# Patient Record
Sex: Female | Born: 1945
Health system: Southern US, Community
[De-identification: ages and names within clinical notes are randomized; demographics above are authoritative.]

## PROBLEM LIST (undated history)

## (undated) DIAGNOSIS — F419 Anxiety disorder, unspecified: Secondary | ICD-10-CM

## (undated) DIAGNOSIS — Z9221 Personal history of antineoplastic chemotherapy: Secondary | ICD-10-CM

## (undated) DIAGNOSIS — Z923 Personal history of irradiation: Secondary | ICD-10-CM

## (undated) DIAGNOSIS — F32A Depression, unspecified: Secondary | ICD-10-CM

## (undated) DIAGNOSIS — R112 Nausea with vomiting, unspecified: Secondary | ICD-10-CM

## (undated) DIAGNOSIS — E039 Hypothyroidism, unspecified: Secondary | ICD-10-CM

## (undated) DIAGNOSIS — Z9889 Other specified postprocedural states: Secondary | ICD-10-CM

## (undated) HISTORY — PX: ABDOMINAL HYSTERECTOMY: SHX81

## (undated) HISTORY — PX: REDUCTION MAMMAPLASTY: SUR839

## (undated) HISTORY — PX: AUGMENTATION MAMMAPLASTY: SUR837

---

## 1998-04-02 ENCOUNTER — Other Ambulatory Visit: Admission: RE | Admit: 1998-04-02 | Discharge: 1998-04-02 | Payer: Self-pay | Admitting: Obstetrics and Gynecology

## 1999-05-23 ENCOUNTER — Other Ambulatory Visit: Admission: RE | Admit: 1999-05-23 | Discharge: 1999-05-23 | Payer: Self-pay | Admitting: *Deleted

## 2000-11-11 ENCOUNTER — Other Ambulatory Visit: Admission: RE | Admit: 2000-11-11 | Discharge: 2000-11-11 | Payer: Self-pay | Admitting: Radiology

## 2000-11-12 ENCOUNTER — Other Ambulatory Visit: Admission: RE | Admit: 2000-11-12 | Discharge: 2000-11-12 | Payer: Self-pay | Admitting: Radiology

## 2000-11-19 ENCOUNTER — Encounter: Admission: RE | Admit: 2000-11-19 | Discharge: 2000-11-19 | Payer: Self-pay | Admitting: *Deleted

## 2000-11-23 ENCOUNTER — Encounter: Admission: RE | Admit: 2000-11-23 | Discharge: 2000-11-23 | Payer: Self-pay | Admitting: *Deleted

## 2000-11-23 ENCOUNTER — Ambulatory Visit (HOSPITAL_BASED_OUTPATIENT_CLINIC_OR_DEPARTMENT_OTHER): Admission: RE | Admit: 2000-11-23 | Discharge: 2000-11-23 | Payer: Self-pay | Admitting: *Deleted

## 2000-11-23 HISTORY — PX: BREAST LUMPECTOMY: SHX2

## 2000-12-09 ENCOUNTER — Encounter: Admission: RE | Admit: 2000-12-09 | Discharge: 2001-02-25 | Payer: Self-pay | Admitting: *Deleted

## 2000-12-15 ENCOUNTER — Encounter: Payer: Self-pay | Admitting: *Deleted

## 2000-12-15 ENCOUNTER — Ambulatory Visit (HOSPITAL_COMMUNITY): Admission: RE | Admit: 2000-12-15 | Discharge: 2000-12-15 | Payer: Self-pay | Admitting: *Deleted

## 2000-12-21 ENCOUNTER — Ambulatory Visit (HOSPITAL_BASED_OUTPATIENT_CLINIC_OR_DEPARTMENT_OTHER): Admission: RE | Admit: 2000-12-21 | Discharge: 2000-12-21 | Payer: Self-pay | Admitting: *Deleted

## 2001-02-25 ENCOUNTER — Ambulatory Visit: Admission: RE | Admit: 2001-02-25 | Discharge: 2001-05-26 | Payer: Self-pay | Admitting: Radiation Oncology

## 2001-06-15 ENCOUNTER — Ambulatory Visit (HOSPITAL_COMMUNITY): Admission: RE | Admit: 2001-06-15 | Discharge: 2001-06-15 | Payer: Self-pay | Admitting: *Deleted

## 2001-07-26 ENCOUNTER — Other Ambulatory Visit: Admission: RE | Admit: 2001-07-26 | Discharge: 2001-07-26 | Payer: Self-pay | Admitting: Obstetrics and Gynecology

## 2001-08-02 ENCOUNTER — Ambulatory Visit (HOSPITAL_BASED_OUTPATIENT_CLINIC_OR_DEPARTMENT_OTHER): Admission: RE | Admit: 2001-08-02 | Discharge: 2001-08-02 | Payer: Self-pay | Admitting: Specialist

## 2003-06-01 ENCOUNTER — Encounter: Payer: Self-pay | Admitting: Oncology

## 2003-06-01 ENCOUNTER — Ambulatory Visit (HOSPITAL_COMMUNITY): Admission: RE | Admit: 2003-06-01 | Discharge: 2003-06-01 | Payer: Self-pay | Admitting: Oncology

## 2003-06-06 ENCOUNTER — Encounter: Payer: Self-pay | Admitting: Oncology

## 2003-06-06 ENCOUNTER — Ambulatory Visit (HOSPITAL_COMMUNITY): Admission: RE | Admit: 2003-06-06 | Discharge: 2003-06-06 | Payer: Self-pay | Admitting: Oncology

## 2004-01-23 ENCOUNTER — Other Ambulatory Visit: Admission: RE | Admit: 2004-01-23 | Discharge: 2004-01-23 | Payer: Self-pay | Admitting: Obstetrics and Gynecology

## 2004-12-18 ENCOUNTER — Ambulatory Visit: Payer: Self-pay | Admitting: Oncology

## 2015-11-06 DIAGNOSIS — M810 Age-related osteoporosis without current pathological fracture: Secondary | ICD-10-CM | POA: Diagnosis not present

## 2015-11-06 DIAGNOSIS — E039 Hypothyroidism, unspecified: Secondary | ICD-10-CM | POA: Diagnosis not present

## 2015-11-06 DIAGNOSIS — J311 Chronic nasopharyngitis: Secondary | ICD-10-CM | POA: Diagnosis not present

## 2015-11-06 DIAGNOSIS — Z853 Personal history of malignant neoplasm of breast: Secondary | ICD-10-CM | POA: Diagnosis not present

## 2015-11-06 DIAGNOSIS — F411 Generalized anxiety disorder: Secondary | ICD-10-CM | POA: Diagnosis not present

## 2016-04-02 DIAGNOSIS — L821 Other seborrheic keratosis: Secondary | ICD-10-CM | POA: Diagnosis not present

## 2016-04-02 DIAGNOSIS — L570 Actinic keratosis: Secondary | ICD-10-CM | POA: Diagnosis not present

## 2016-05-14 DIAGNOSIS — Z23 Encounter for immunization: Secondary | ICD-10-CM | POA: Diagnosis not present

## 2016-05-14 DIAGNOSIS — E039 Hypothyroidism, unspecified: Secondary | ICD-10-CM | POA: Diagnosis not present

## 2016-05-14 DIAGNOSIS — F411 Generalized anxiety disorder: Secondary | ICD-10-CM | POA: Diagnosis not present

## 2016-05-14 DIAGNOSIS — J309 Allergic rhinitis, unspecified: Secondary | ICD-10-CM | POA: Diagnosis not present

## 2016-07-22 DIAGNOSIS — J181 Lobar pneumonia, unspecified organism: Secondary | ICD-10-CM | POA: Diagnosis not present

## 2016-07-28 ENCOUNTER — Other Ambulatory Visit: Payer: Self-pay | Admitting: Family Medicine

## 2016-07-28 ENCOUNTER — Ambulatory Visit
Admission: RE | Admit: 2016-07-28 | Discharge: 2016-07-28 | Disposition: A | Discharge status: Home / Self Care | Payer: Self-pay | Source: Ambulatory Visit | Attending: Family Medicine | Admitting: Family Medicine

## 2016-07-28 DIAGNOSIS — R05 Cough: Secondary | ICD-10-CM | POA: Diagnosis not present

## 2016-07-28 DIAGNOSIS — J189 Pneumonia, unspecified organism: Secondary | ICD-10-CM

## 2016-07-28 DIAGNOSIS — J181 Lobar pneumonia, unspecified organism: Principal | ICD-10-CM

## 2016-11-12 DIAGNOSIS — F411 Generalized anxiety disorder: Secondary | ICD-10-CM | POA: Diagnosis not present

## 2016-11-12 DIAGNOSIS — E039 Hypothyroidism, unspecified: Secondary | ICD-10-CM | POA: Diagnosis not present

## 2016-11-12 DIAGNOSIS — J309 Allergic rhinitis, unspecified: Secondary | ICD-10-CM | POA: Diagnosis not present

## 2016-11-12 DIAGNOSIS — Z1211 Encounter for screening for malignant neoplasm of colon: Secondary | ICD-10-CM | POA: Diagnosis not present

## 2016-11-21 DIAGNOSIS — L710 Perioral dermatitis: Secondary | ICD-10-CM | POA: Diagnosis not present

## 2017-05-15 DIAGNOSIS — J309 Allergic rhinitis, unspecified: Secondary | ICD-10-CM | POA: Diagnosis not present

## 2017-05-15 DIAGNOSIS — E039 Hypothyroidism, unspecified: Secondary | ICD-10-CM | POA: Diagnosis not present

## 2017-05-15 DIAGNOSIS — F411 Generalized anxiety disorder: Secondary | ICD-10-CM | POA: Diagnosis not present

## 2017-05-15 DIAGNOSIS — Z23 Encounter for immunization: Secondary | ICD-10-CM | POA: Diagnosis not present

## 2017-06-22 DIAGNOSIS — E039 Hypothyroidism, unspecified: Secondary | ICD-10-CM | POA: Diagnosis not present

## 2017-11-02 DIAGNOSIS — F411 Generalized anxiety disorder: Secondary | ICD-10-CM | POA: Diagnosis not present

## 2017-11-02 DIAGNOSIS — J309 Allergic rhinitis, unspecified: Secondary | ICD-10-CM | POA: Diagnosis not present

## 2017-11-02 DIAGNOSIS — E039 Hypothyroidism, unspecified: Secondary | ICD-10-CM | POA: Diagnosis not present

## 2018-04-20 DIAGNOSIS — H25093 Other age-related incipient cataract, bilateral: Secondary | ICD-10-CM | POA: Diagnosis not present

## 2018-05-07 DIAGNOSIS — H2512 Age-related nuclear cataract, left eye: Secondary | ICD-10-CM | POA: Diagnosis not present

## 2018-05-07 DIAGNOSIS — H2513 Age-related nuclear cataract, bilateral: Secondary | ICD-10-CM | POA: Diagnosis not present

## 2018-05-17 DIAGNOSIS — Z0184 Encounter for antibody response examination: Secondary | ICD-10-CM | POA: Diagnosis not present

## 2018-05-17 DIAGNOSIS — Z23 Encounter for immunization: Secondary | ICD-10-CM | POA: Diagnosis not present

## 2018-05-17 DIAGNOSIS — E039 Hypothyroidism, unspecified: Secondary | ICD-10-CM | POA: Diagnosis not present

## 2018-05-17 DIAGNOSIS — F411 Generalized anxiety disorder: Secondary | ICD-10-CM | POA: Diagnosis not present

## 2018-05-17 DIAGNOSIS — L309 Dermatitis, unspecified: Secondary | ICD-10-CM | POA: Diagnosis not present

## 2018-06-03 DIAGNOSIS — H2512 Age-related nuclear cataract, left eye: Secondary | ICD-10-CM | POA: Diagnosis not present

## 2018-06-04 DIAGNOSIS — H2511 Age-related nuclear cataract, right eye: Secondary | ICD-10-CM | POA: Diagnosis not present

## 2018-06-24 DIAGNOSIS — H2511 Age-related nuclear cataract, right eye: Secondary | ICD-10-CM | POA: Diagnosis not present

## 2018-12-23 DIAGNOSIS — F411 Generalized anxiety disorder: Secondary | ICD-10-CM | POA: Diagnosis not present

## 2018-12-23 DIAGNOSIS — J309 Allergic rhinitis, unspecified: Secondary | ICD-10-CM | POA: Diagnosis not present

## 2018-12-23 DIAGNOSIS — L309 Dermatitis, unspecified: Secondary | ICD-10-CM | POA: Diagnosis not present

## 2018-12-23 DIAGNOSIS — E039 Hypothyroidism, unspecified: Secondary | ICD-10-CM | POA: Diagnosis not present

## 2019-02-12 DIAGNOSIS — S81812A Laceration without foreign body, left lower leg, initial encounter: Secondary | ICD-10-CM | POA: Diagnosis not present

## 2019-04-27 DIAGNOSIS — H52229 Regular astigmatism, unspecified eye: Secondary | ICD-10-CM | POA: Diagnosis not present

## 2019-04-27 DIAGNOSIS — H524 Presbyopia: Secondary | ICD-10-CM | POA: Diagnosis not present

## 2019-04-27 DIAGNOSIS — H5200 Hypermetropia, unspecified eye: Secondary | ICD-10-CM | POA: Diagnosis not present

## 2019-05-20 DIAGNOSIS — C44319 Basal cell carcinoma of skin of other parts of face: Secondary | ICD-10-CM | POA: Diagnosis not present

## 2019-05-20 DIAGNOSIS — L820 Inflamed seborrheic keratosis: Secondary | ICD-10-CM | POA: Diagnosis not present

## 2019-06-21 DIAGNOSIS — C44319 Basal cell carcinoma of skin of other parts of face: Secondary | ICD-10-CM | POA: Diagnosis not present

## 2019-07-01 DIAGNOSIS — F411 Generalized anxiety disorder: Secondary | ICD-10-CM | POA: Diagnosis not present

## 2019-07-01 DIAGNOSIS — E039 Hypothyroidism, unspecified: Secondary | ICD-10-CM | POA: Diagnosis not present

## 2019-08-18 DIAGNOSIS — E039 Hypothyroidism, unspecified: Secondary | ICD-10-CM | POA: Diagnosis not present

## 2019-09-02 DIAGNOSIS — E119 Type 2 diabetes mellitus without complications: Secondary | ICD-10-CM

## 2019-09-02 DIAGNOSIS — C801 Malignant (primary) neoplasm, unspecified: Secondary | ICD-10-CM

## 2019-09-02 HISTORY — PX: EYE SURGERY: SHX253

## 2019-09-02 HISTORY — DX: Type 2 diabetes mellitus without complications: E11.9

## 2019-09-02 HISTORY — DX: Malignant (primary) neoplasm, unspecified: C80.1

## 2019-09-27 ENCOUNTER — Ambulatory Visit: Payer: PPO

## 2019-10-07 ENCOUNTER — Ambulatory Visit: Payer: PPO | Attending: Internal Medicine

## 2019-10-07 DIAGNOSIS — Z23 Encounter for immunization: Secondary | ICD-10-CM

## 2019-10-07 NOTE — Progress Notes (Signed)
   Covid-19 Vaccination Clinic  Name:  Patty Lopez    MRN: [id number] DOB: 09-16-1945  10/07/2019  Patty Lopez was observed post Covid-19 immunization for 15 minutes without incidence. She was provided with Vaccine Information Sheet and instruction to access the V-Safe system.   Patty Lopez was instructed to call 911 with any severe reactions post vaccine: Marland Kitchen Difficulty breathing  . Swelling of your face and throat  . A fast heartbeat  . A bad rash all over your body  . Dizziness and weakness    Immunizations Administered    Name Date Dose VIS Date Route   Pfizer COVID-19 Vaccine 10/07/2019  6:05 PM 0.3 mL 08/12/2019 Intramuscular   Manufacturer: [place]   Lot: [id number]   [place]: [id number]

## 2019-11-01 ENCOUNTER — Ambulatory Visit: Payer: PPO

## 2019-11-01 ENCOUNTER — Ambulatory Visit: Payer: PPO | Attending: Internal Medicine

## 2019-11-01 DIAGNOSIS — Z23 Encounter for immunization: Secondary | ICD-10-CM | POA: Insufficient documentation

## 2019-11-01 NOTE — Progress Notes (Signed)
   Covid-19 Vaccination Clinic  Name:  Patty Lopez    MRN: [id number] DOB: 11-28-1945  11/01/2019  [name] was observed post Covid-19 immunization for 30 minutes based on pre-vaccination screening without incident. She was provided with Vaccine Information Sheet and instruction to access the V-Safe system.   [name] was instructed to call 911 with any severe reactions post vaccine: Marland Kitchen Difficulty breathing  . Swelling of face and throat  . A fast heartbeat  . A bad rash all over body  . Dizziness and weakness   Immunizations Administered    Name Date Dose VIS Date Route   Pfizer COVID-19 Vaccine 11/01/2019  4:23 PM 0.3 mL 08/12/2019 Intramuscular   Manufacturer: [place]   Lot: [id number]   [place]: [id number]

## 2019-12-19 DIAGNOSIS — E039 Hypothyroidism, unspecified: Secondary | ICD-10-CM | POA: Diagnosis not present

## 2019-12-26 DIAGNOSIS — J309 Allergic rhinitis, unspecified: Secondary | ICD-10-CM | POA: Diagnosis not present

## 2019-12-26 DIAGNOSIS — E1165 Type 2 diabetes mellitus with hyperglycemia: Secondary | ICD-10-CM | POA: Diagnosis not present

## 2019-12-26 DIAGNOSIS — R5383 Other fatigue: Secondary | ICD-10-CM | POA: Diagnosis not present

## 2019-12-26 DIAGNOSIS — L309 Dermatitis, unspecified: Secondary | ICD-10-CM | POA: Diagnosis not present

## 2019-12-26 DIAGNOSIS — Z1211 Encounter for screening for malignant neoplasm of colon: Secondary | ICD-10-CM | POA: Diagnosis not present

## 2019-12-26 DIAGNOSIS — F411 Generalized anxiety disorder: Secondary | ICD-10-CM | POA: Diagnosis not present

## 2019-12-27 DIAGNOSIS — E1165 Type 2 diabetes mellitus with hyperglycemia: Secondary | ICD-10-CM | POA: Diagnosis not present

## 2019-12-27 DIAGNOSIS — E119 Type 2 diabetes mellitus without complications: Secondary | ICD-10-CM | POA: Diagnosis not present

## 2020-01-04 DIAGNOSIS — Z1211 Encounter for screening for malignant neoplasm of colon: Secondary | ICD-10-CM | POA: Diagnosis not present

## 2020-01-04 DIAGNOSIS — E1165 Type 2 diabetes mellitus with hyperglycemia: Secondary | ICD-10-CM | POA: Diagnosis not present

## 2020-01-04 DIAGNOSIS — N63 Unspecified lump in unspecified breast: Secondary | ICD-10-CM | POA: Diagnosis not present

## 2020-01-05 ENCOUNTER — Other Ambulatory Visit: Payer: Self-pay | Admitting: Family Medicine

## 2020-01-05 DIAGNOSIS — N6320 Unspecified lump in the left breast, unspecified quadrant: Secondary | ICD-10-CM

## 2020-01-16 ENCOUNTER — Ambulatory Visit
Admission: RE | Admit: 2020-01-16 | Discharge: 2020-01-16 | Disposition: A | Discharge status: Home / Self Care | Payer: PPO | Source: Ambulatory Visit | Attending: Family Medicine | Admitting: Family Medicine

## 2020-01-16 ENCOUNTER — Other Ambulatory Visit: Payer: Self-pay | Admitting: Family Medicine

## 2020-01-16 ENCOUNTER — Other Ambulatory Visit: Payer: Self-pay

## 2020-01-16 DIAGNOSIS — N6320 Unspecified lump in the left breast, unspecified quadrant: Secondary | ICD-10-CM

## 2020-01-16 DIAGNOSIS — R922 Inconclusive mammogram: Secondary | ICD-10-CM | POA: Diagnosis not present

## 2020-01-16 DIAGNOSIS — N6489 Other specified disorders of breast: Secondary | ICD-10-CM | POA: Diagnosis not present

## 2020-01-16 DIAGNOSIS — R921 Mammographic calcification found on diagnostic imaging of breast: Secondary | ICD-10-CM

## 2020-01-16 HISTORY — DX: Personal history of antineoplastic chemotherapy: Z92.21

## 2020-01-16 HISTORY — DX: Personal history of irradiation: Z92.3

## 2020-01-19 ENCOUNTER — Ambulatory Visit
Admission: RE | Admit: 2020-01-19 | Discharge: 2020-01-19 | Disposition: A | Discharge status: Home / Self Care | Payer: PPO | Source: Ambulatory Visit | Attending: Family Medicine | Admitting: Family Medicine

## 2020-01-19 ENCOUNTER — Other Ambulatory Visit: Payer: Self-pay

## 2020-01-19 DIAGNOSIS — R921 Mammographic calcification found on diagnostic imaging of breast: Secondary | ICD-10-CM

## 2020-01-19 DIAGNOSIS — Z170 Estrogen receptor positive status [ER+]: Secondary | ICD-10-CM | POA: Diagnosis not present

## 2020-01-19 DIAGNOSIS — N6320 Unspecified lump in the left breast, unspecified quadrant: Secondary | ICD-10-CM

## 2020-01-19 DIAGNOSIS — C50412 Malignant neoplasm of upper-outer quadrant of left female breast: Secondary | ICD-10-CM | POA: Diagnosis not present

## 2020-01-19 DIAGNOSIS — N6489 Other specified disorders of breast: Secondary | ICD-10-CM | POA: Diagnosis not present

## 2020-01-19 DIAGNOSIS — N6321 Unspecified lump in the left breast, upper outer quadrant: Secondary | ICD-10-CM | POA: Diagnosis not present

## 2020-01-20 ENCOUNTER — Other Ambulatory Visit: Payer: Self-pay | Admitting: Family Medicine

## 2020-01-20 DIAGNOSIS — N6489 Other specified disorders of breast: Secondary | ICD-10-CM

## 2020-01-20 DIAGNOSIS — R921 Mammographic calcification found on diagnostic imaging of breast: Secondary | ICD-10-CM

## 2020-01-24 DIAGNOSIS — R195 Other fecal abnormalities: Secondary | ICD-10-CM | POA: Diagnosis not present

## 2020-01-24 DIAGNOSIS — C50919 Malignant neoplasm of unspecified site of unspecified female breast: Secondary | ICD-10-CM | POA: Diagnosis not present

## 2020-01-24 DIAGNOSIS — E1165 Type 2 diabetes mellitus with hyperglycemia: Secondary | ICD-10-CM | POA: Diagnosis not present

## 2020-01-26 ENCOUNTER — Other Ambulatory Visit: Payer: Self-pay | Admitting: Family Medicine

## 2020-01-26 ENCOUNTER — Ambulatory Visit
Admission: RE | Admit: 2020-01-26 | Discharge: 2020-01-26 | Disposition: A | Discharge status: Home / Self Care | Payer: PPO | Source: Ambulatory Visit | Attending: Family Medicine | Admitting: Family Medicine

## 2020-01-26 ENCOUNTER — Other Ambulatory Visit: Payer: Self-pay

## 2020-01-26 DIAGNOSIS — N6489 Other specified disorders of breast: Secondary | ICD-10-CM

## 2020-01-26 DIAGNOSIS — D0502 Lobular carcinoma in situ of left breast: Secondary | ICD-10-CM | POA: Diagnosis not present

## 2020-01-26 DIAGNOSIS — D242 Benign neoplasm of left breast: Secondary | ICD-10-CM | POA: Diagnosis not present

## 2020-01-26 DIAGNOSIS — R921 Mammographic calcification found on diagnostic imaging of breast: Secondary | ICD-10-CM

## 2020-01-26 DIAGNOSIS — R928 Other abnormal and inconclusive findings on diagnostic imaging of breast: Secondary | ICD-10-CM | POA: Diagnosis not present

## 2020-01-27 ENCOUNTER — Ambulatory Visit: Payer: Self-pay | Admitting: Surgery

## 2020-01-27 DIAGNOSIS — C50412 Malignant neoplasm of upper-outer quadrant of left female breast: Secondary | ICD-10-CM

## 2020-01-27 DIAGNOSIS — Z170 Estrogen receptor positive status [ER+]: Secondary | ICD-10-CM

## 2020-01-27 DIAGNOSIS — C50912 Malignant neoplasm of unspecified site of left female breast: Secondary | ICD-10-CM | POA: Diagnosis not present

## 2020-01-27 NOTE — H&P (Signed)
Patty Lopez Appointment: 01/27/2020 9:40 AM Location: [facility] Patient #: [id number] DOB: 1946-03-09 Undefined / Language: Patty Lopez / Race: White Female  History of Present Illness Patty Moores A. Patty Ezelle MD; 01/27/2020 12:25 PM) Patient words: Patient presents for evaluation of left breast mass detected by mammogram. History of right breast cancer in 2002 treated with breast conserving surgery and postoperative chemotherapy and radiation therapy. She was found to have a left breast mass outer quadrant upper outer quadrant measuring 2.6 cm. Core biopsy showed grade 2 invasive ductal carcinoma as to receptor positive progesterone receptor positive her 2 neu  Negative with a KI 67 at 15%   she is interested in breast conserving surgery.  She had additional biopsy which showed lobular carcinoma in situ and a fibroadenomatoid change and fibroadenoma. Final pathology is not back and E cadherin is pending.    ADDITIONAL INFORMATION: 1. PROGNOSTIC INDICATORS Results: IMMUNOHISTOCHEMICAL AND MORPHOMETRIC ANALYSIS PERFORMED MANUALLY The tumor cells are NEGATIVE for Her2 (1+). Estrogen Receptor: 100%, POSITIVE, STRONG STAINING INTENSITY Progesterone Receptor: 100%, POSITIVE, STRONG STAINING INTENSITY Proliferation Marker Ki67: 12% REFERENCE RANGE ESTROGEN RECEPTOR NEGATIVE 0% POSITIVE =>1% REFERENCE RANGE PROGESTERONE RECEPTOR NEGATIVE 0% POSITIVE =>1% All controls stained appropriately Patty Cutter MD Pathologist, Electronic Signature ( Signed 01/23/2020) 1. E-cadherin is negative consistent with a lobular phenotype. Patty Males MD Pathologist, Electronic Signature ( Signed 01/20/2020) 1 of 3 FINAL for [place], [place] [phone]) FINAL DIAGNOSIS Diagnosis 1. Breast, left, needle core biopsy, 2 o'clock - INVASIVE MAMMARY CARCINOMA, SEE COMMENT. - MAMMARY CARCINOMA IN SITU. 2. Breast, left, needle core biopsy, upper inner - FIBROADENOMATOID CHANGE WITH  CALCIFICATIONS. - NO MALIGNANCY IDENTIFIED. Microscopic Comment 1. The carcinoma appears grade 2 and measures 10 mm in greatest linear extent. E-cadherin will be ordered. Prognostic makers will be ordered. Dr. Jeannie Lopez has reviewed the case. The case was called to [facility] on 01/20/2020.                  History of right breast cancer status post lumpectomy in 2002. The patient complains of a palpable abnormality in the left breast.  EXAM: DIGITAL DIAGNOSTIC BILATERAL MAMMOGRAM WITH IMPLANTS, CAD AND TOMO  ULTRASOUND LEFT BREAST  The patient has a right retropectoral implant.  COMPARISON: None.  ACR Breast Density Category c: The breast tissue is heterogeneously dense, which may obscure small masses.  FINDINGS: Lumpectomy changes are seen in the right breast. A retropectoral implant is identified on the right MLO view. No suspicious mass or malignant type microcalcifications identified in the right breast.  There is a spiculated mass associated with coarse heterogeneous calcifications in the upper-outer quadrant of the left breast. There are grouped coarse heterogeneous calcifications in the upper inner quadrant the right breast spanning 1.6 cm.  Mammographic images were processed with CAD.  On physical exam, I palpate a discrete mass in the left breast at 2 o'clock 4 cm from the nipple.  Targeted ultrasound is performed, showing an irregular hypoechoic mass in the left breast at 2 o'clock 4 cm from the nipple measuring 2.6 x 1.6 x 2.3 cm. Sonographic evaluation the left axilla does not show any enlarged adenopathy.  IMPRESSION: Suspicious irregular mass in the 2 o'clock region of the left breast. Indeterminate calcifications in the upper inner quadrant of the left breast.  RECOMMENDATION: Ultrasound-guided core biopsy of the mass in the upper-outer quadrant of the left breast and stereotactic biopsy of the calcifications in the  upper-inner quadrant of the left breast is  recommended.  I have discussed the findings and recommendations with the patient. If applicable, a reminder letter will be sent to the patient regarding the next appointment.  BI-RADS CATEGORY 5: Highly suggestive of malignancy.   Electronically Signed By: Patty Lopez M.D. On: 01/16/2020 14:18    Diagnosis 1. Breast, left, needle core biopsy, central posterior (x clip) - LOBULAR NEOPLASIA (LOBULAR CARCINOMA IN SITU). - COMPLEX SCLEROSING LESION WITH CALCIFICATIONS. - SEE COMMENT. 2. Breast, left, needle core biopsy, upper central - FIBROADENOMA WITH CALCIFICATIONS. - VASCULAR CALCIFICATIONS. - THERE IS NO EVIDENCE OF MALIGNANCY. Microscopic.  The patient is a 74 year old female.   Past Surgical History Patty Coke, RN; 01/27/2020 9:57 AM) Breast Biopsy Right.  Allergies Patty Coke, RN; 01/27/2020 9:57 AM) No Known Allergies [01/27/2020]: Allergies Reconciled  Medication History (Patty Herrin, RN; 01/27/2020 10:00 AM) clonazePAM ('1MG'$  Tablet, Oral) Active. Fluticasone Propionate (50MCG/ACT Suspension, Nasal) Active. Lantus SoloStar (100UNIT/ML Soln Pen-inj, Subcutaneous) Active. Levothyroxine Sodium (112MCG Tablet, Oral) Active. metFORMIN HCl ('1000MG'$  Tablet, Oral) Active. Sertraline HCl ('100MG'$  Tablet, Oral) Active. Multivitamin (Oral) Active. Calcium 500/Vitamin D (500-'125MG'$ -UNIT Tablet, Oral) Active. Medications Reconciled  Social History Patty Coke, RN; 01/27/2020 9:57 AM) Caffeine use Carbonated beverages, Coffee. Tobacco use Former smoker.  Pregnancy / Birth History Patty Coke, RN; 01/27/2020 9:57 AM) Age at menarche 64 years. Age of menopause [age]  Other Problems Patty Coke, RN; 01/27/2020 9:57 AM) Anxiety Disorder     Review of Systems (Patty Herrin RN; 01/27/2020 9:57 AM) HEENT Present- Seasonal Allergies and Wears glasses/contact lenses. Not Present- Earache, Hearing Loss,  Hoarseness, Nose Bleed, Oral Ulcers, Ringing in the Ears, Sinus Pain, Sore Throat, Visual Disturbances and Yellow Eyes.  Vitals (Patty Herrin RN; 01/27/2020 10:01 AM) 01/27/2020 10:00 AM Weight: 134.38 lb Height: 63in Body Surface Area: 1.63 m Body Mass Index: 23.8 kg/m  Temp.: 97.11F  Pulse: 85 (Regular)  P.OX: 98% (Room air) BP: 132/78(Sitting, Left Arm, Standard)        Physical Exam (Shaconda Hajduk A. Balin Vandegrift MD; 01/27/2020 12:27 PM)  General Mental Status-Alert. General Appearance-Consistent with stated age. Hydration-Well hydrated. Voice-Normal.  Head and Neck Head-normocephalic, atraumatic with no lesions or palpable masses. Trachea-midline. Thyroid Gland Characteristics - normal size and consistency.  Chest and Lung Exam Chest and lung exam reveals -quiet, even and easy respiratory effort with no use of accessory muscles and on auscultation, normal breath sounds, no adventitious sounds and normal vocal resonance. Inspection Chest Wall - Normal. Back - normal.  Breast Note: Right breast shows postlumpectomy changes. No mass. Mild radiation change with mild cosmetic deformity noted. Left breast is bruised. There is some erythema which appears to be secondary from the biopsy and/or prep. There are multiple Band-Aids and Steri-Strips on the left breast. Left breast is swollen post biopsy.  Neurologic Neurologic evaluation reveals -alert and oriented x 3 with no impairment of recent or remote memory. Mental Status-Normal.  Neuropsychiatric Examination of related systems reveals-The patient is well-nourished and well-groomed. The patient's mood and affect are described as -anxious. Associations-circumstantial and loose.  Musculoskeletal Normal Exam - Left-Upper Extremity Strength Normal and Lower Extremity Strength Normal. Normal Exam - Right-Upper Extremity Strength Normal and Lower Extremity Strength Normal.  Lymphatic Head &  Neck  General Head & Neck Lymphatics: Bilateral - Description - Normal. Axillary  General Axillary Region: Bilateral - Description - Normal. Tenderness - Non Tender.    Assessment & Plan (Simmie Camerer A. Jamont Mellin MD; 01/27/2020 12:32 PM)  BREAST CANCER, LEFT (C50.912) Impression: Await final pathology  If this  is lobular, MRI may be helpful  Otherwise, plan on doing left breast seed localized lumpectomy 2 to target the malignancy in the LCIS. The other 2 areas are benign. She will require sentinel lymph node mapping as well which we've discussed. She has had almost Lopez before surgery she is familiar with the surgery. She does have some memory issues it appears talking with her today but her husband was present and all questions were answered. Referral to medical and radiation oncology.    Risk of lumpectomy include bleeding, infection, seroma, more surgery, use of seed/wire, wound care, cosmetic deformity and the need for other treatments, death , blood clots, death. Pt agrees to proceed.    Total time 45 minutes  Current Plans You are being scheduled for surgery- Our schedulers will call you.  You should hear from our office's scheduling department within 5 working days about the location, date, and time of surgery. We try to make accommodations for patient's preferences in scheduling surgery, but sometimes the OR schedule or the surgeon's schedule prevents [place] from making those accommodations.  If you have not heard from our office [phone] in 5 working days, call the office and ask for your surgeon's nurse.  If you have other questions about your diagnosis, plan, or surgery, call the office and ask for your surgeon's nurse.  Pt Education - [facility] Breast Cancer Information Given - [facility] "Breast Journey" Package We discussed the staging and pathophysiology of breast cancer. We discussed all of the different options for treatment for breast cancer including surgery, chemotherapy,  radiation therapy, Herceptin, and antiestrogen therapy. We discussed a sentinel lymph node biopsy as she does not appear to having lymph node involvement right now. We discussed the performance of that with injection of radioactive tracer and blue dye. We discussed that she would have an incision underneath her axillary hairline. We discussed that there is a bout a 10-20% chance of having a positive node with a sentinel lymph node biopsy and we will await the permanent pathology to make any other first further decisions in terms of her treatment. One of these options might be to return to the operating room to perform an axillary lymph node dissection. We discussed about a 1-2% risk lifetime of chronic shoulder pain as well as lymphedema associated with a sentinel lymph node biopsy. We discussed the options for treatment of the breast cancer which included lumpectomy versus a mastectomy. We discussed the performance of the lumpectomy with a wire placement. We discussed a 10-20% chance of a positive margin requiring reexcision in the operating room. We also discussed that she may need radiation therapy or antiestrogen therapy or both if she undergoes lumpectomy. We discussed the mastectomy and the postoperative care for that as well. We discussed that there is no difference in her survival whether she undergoes lumpectomy with radiation therapy or antiestrogen therapy versus a mastectomy. There is a slight difference in the local recurrence rate being 3-5% with lumpectomy and about 1% with a mastectomy. We discussed the risks of operation including bleeding, infection, possible reoperation. She understands her further therapy will be based on what her stages at the time of her operation.  Pt Education - [facility] breast cancer surgery: discussed with patient and provided information. Pt Education - [facility] Breast Biopsy HCI: discussed with patient and provided information.

## 2020-02-02 DIAGNOSIS — C50919 Malignant neoplasm of unspecified site of unspecified female breast: Secondary | ICD-10-CM | POA: Diagnosis not present

## 2020-02-02 DIAGNOSIS — Z7984 Long term (current) use of oral hypoglycemic drugs: Secondary | ICD-10-CM | POA: Diagnosis not present

## 2020-02-02 DIAGNOSIS — R195 Other fecal abnormalities: Secondary | ICD-10-CM | POA: Diagnosis not present

## 2020-02-02 DIAGNOSIS — E119 Type 2 diabetes mellitus without complications: Secondary | ICD-10-CM | POA: Diagnosis not present

## 2020-02-03 ENCOUNTER — Telehealth: Payer: Self-pay | Admitting: Hematology and Oncology

## 2020-02-03 ENCOUNTER — Other Ambulatory Visit: Payer: Self-pay | Admitting: Surgery

## 2020-02-03 DIAGNOSIS — C50912 Malignant neoplasm of unspecified site of left female breast: Secondary | ICD-10-CM

## 2020-02-03 NOTE — Telephone Encounter (Signed)
Received a new pt referral from Dr. Brantley Stage for dx of breast cancer. [name] has been cld and scheduled to see [name] on 6/7 at 4:15pm. Pt aware to arrive 15 minutes early.

## 2020-02-05 NOTE — Progress Notes (Signed)
[facility] CONSULT NOTE  Patient Care Team: London Pepper, MD as PCP - General (Family Medicine)  CHIEF COMPLAINTS/PURPOSE OF CONSULTATION:  Newly diagnosed breast cancer  HISTORY OF PRESENTING ILLNESS:  Patty Lopez 74 y.o. female is here because of recent diagnosis of invasive mammary carcinoma of the left breast. She has a history of right breast cancer s/p lumpectomy in 2002. She palpated a left breast abnormality. Mammogram and [place] on 01/06/20 showed calcifications in the upper inner left breast spanning 1.6cm, a 2.6cm mass at the 2 o'clock position, and no left axillary adenopathy. Biopsy on 01/19/20 showed no malignancy at the site of the upper inner left breast calcifications, and at the 2 o'clock position, invasive lobular carcinoma, grade 2, HER-2 negative (1+), ER/PR+ 100%, Ki67 12%. Biopsy of the upper central left breast on 01/26/20 showed LCIS. She presents to the clinic today for initial evaluation and discussion of treatment options.   I reviewed her records extensively and collaborated the history with the patient.  SUMMARY OF ONCOLOGIC HISTORY: Oncology History  Malignant neoplasm of upper-outer quadrant of left breast in female, estrogen receptor positive ([place])  01/26/2020 Initial Diagnosis   Left breast biopsy: LCIS, CSL, fibroadenoma.  Foci mildly suspicious for early stromal invasion.  Pleomorphic features     MEDICAL HISTORY:  Past Medical History:  Diagnosis Date  . Personal history of chemotherapy   . Personal history of radiation therapy     SURGICAL HISTORY: Past Surgical History:  Procedure Laterality Date  . AUGMENTATION MAMMAPLASTY Right   . BREAST LUMPECTOMY Right 11/23/2000  . REDUCTION MAMMAPLASTY Left     SOCIAL HISTORY: Social History   Socioeconomic History  . Marital status: Married    Spouse name: Not on file  . Number of children: Not on file  . Years of education: Not on file  . Highest education level: Not on file    Occupational History  . Not on file  Tobacco Use  . Smoking status: Not on file  Substance and Sexual Activity  . Alcohol use: Not on file  . Drug use: Not on file  . Sexual activity: Not on file  Other Topics Concern  . Not on file  Social History Narrative  . Not on file   Social Determinants of Health   Financial Resource Strain:   . Difficulty of Paying Living Expenses:   Food Insecurity:   . Worried About Charity fundraiser in the Last Year:   . Arboriculturist in the Last Year:   Transportation Needs:   . Film/video editor (Medical):   Marland Kitchen Lack of Transportation (Non-Medical):   Physical Activity:   . Days of Exercise per Week:   . Minutes of Exercise per Session:   Stress:   . Feeling of Stress :   Social Connections:   . Frequency of Communication with Friends and Family:   . Frequency of Social Gatherings with Friends and Family:   . Attends Religious Services:   . Active Member of Clubs or Organizations:   . Attends Archivist Meetings:   Marland Kitchen Marital Status:   Intimate Partner Violence:   . Fear of Current or Ex-Partner:   . Emotionally Abused:   Marland Kitchen Physically Abused:   . Sexually Abused:     FAMILY HISTORY: No family history on file.  ALLERGIES:  has no allergies on file.  MEDICATIONS:  No current outpatient medications on file.   No current facility-administered medications for  this visit.    REVIEW OF SYSTEMS:   Constitutional: Denies fevers, chills or abnormal night sweats Eyes: Denies blurriness of vision, double vision or watery eyes Ears, nose, mouth, throat, and face: Denies mucositis or sore throat Respiratory: Denies cough, dyspnea or wheezes Cardiovascular: Denies palpitation, chest discomfort or lower extremity swelling Gastrointestinal:  Denies nausea, heartburn or change in bowel habits Skin: Denies abnormal skin rashes Lymphatics: Denies new lymphadenopathy or easy bruising Neurological:Denies numbness, tingling or  new weaknesses Behavioral/Psych: Mood is stable, no new changes  Breast: Palpable left breast mass All other systems were reviewed with the patient and are negative.  PHYSICAL EXAMINATION: ECOG PERFORMANCE STATUS: 1 - Symptomatic but completely ambulatory  There were no vitals filed for this visit. There were no vitals filed for this visit.  GENERAL:alert, no distress and comfortable SKIN: skin color, texture, turgor are normal, no rashes or significant lesions EYES: normal, conjunctiva are pink and non-injected, sclera clear OROPHARYNX:no exudate, no erythema and lips, buccal mucosa, and tongue normal  NECK: supple, thyroid normal size, non-tender, without nodularity LYMPH:  no palpable lymphadenopathy in the cervical, axillary or inguinal LUNGS: clear to auscultation and percussion with normal breathing effort HEART: regular rate & rhythm and no murmurs and no lower extremity edema ABDOMEN:abdomen soft, non-tender and normal bowel sounds Musculoskeletal:no cyanosis of digits and no clubbing  PSYCH: alert & oriented x 3 with fluent speech NEURO: no focal motor/sensory deficits     RADIOGRAPHIC STUDIES: I have personally reviewed the radiological reports and agreed with the findings in the report.  ASSESSMENT AND PLAN:  Malignant neoplasm of upper-outer quadrant of left breast in female, estrogen receptor positive ([place]) Mammogram detected left breast mass with a history of right breast cancer in 2002 treated with lumpectomy chemotherapy and radiation.  The left breast mass measured 2.6 cm.  Biopsy revealed grade 2 invasive lobular carcinoma ER/PR positive HER-2 negative with a Ki-67 of 15%.  Additional biopsies revealed LCIS and CSL.  T2N0 stage Ib  Pathology and radiology counseling:Discussed with the patient, the details of pathology including the type of breast cancer,the clinical staging, the significance of ER, PR and HER-2/neu receptors and the implications for treatment.  After reviewing the pathology in detail, we proceeded to discuss the different treatment options between surgery, radiation, chemotherapy, antiestrogen therapies.  Recommendations: Breast MRI will be performed because it is lobular 1. Breast conserving surgery followed by 2. Oncotype DX testing to determine if chemotherapy would be of any benefit followed by 3. Adjuvant radiation therapy followed by 4. Adjuvant antiestrogen therapy  Oncotype counseling: I discussed Oncotype DX test. I explained to the patient that this is a 21 gene panel to evaluate patient tumors DNA to calculate recurrence score. This would help determine whether patient has high risk or intermediate risk or low risk breast cancer. She understands that if her tumor was found to be high risk, she would benefit from systemic chemotherapy. If low risk, no need of chemotherapy. If she was found to be intermediate risk, we would need to evaluate the score as well as other risk factors and determine if an abbreviated chemotherapy may be of benefit.  Return to clinic after surgery to discuss final pathology report and then determine if Oncotype DX testing will need to be sent.     All questions were answered. The patient knows to call the clinic with any problems, questions or concerns.   Rulon Eisenmenger, MD, MPH 02/06/2020    I, Molly Dorshimer, am acting  as scribe for Nicholas Lose, MD.  I have reviewed the above documentation for accuracy and completeness, and I agree with the above.

## 2020-02-06 ENCOUNTER — Other Ambulatory Visit: Payer: Self-pay

## 2020-02-06 ENCOUNTER — Inpatient Hospital Stay: Payer: PPO | Attending: Hematology and Oncology | Admitting: Hematology and Oncology

## 2020-02-06 DIAGNOSIS — Z170 Estrogen receptor positive status [ER+]: Secondary | ICD-10-CM | POA: Diagnosis not present

## 2020-02-06 DIAGNOSIS — Z9221 Personal history of antineoplastic chemotherapy: Secondary | ICD-10-CM | POA: Insufficient documentation

## 2020-02-06 DIAGNOSIS — Z923 Personal history of irradiation: Secondary | ICD-10-CM | POA: Diagnosis not present

## 2020-02-06 DIAGNOSIS — C50412 Malignant neoplasm of upper-outer quadrant of left female breast: Secondary | ICD-10-CM | POA: Insufficient documentation

## 2020-02-06 DIAGNOSIS — Z853 Personal history of malignant neoplasm of breast: Secondary | ICD-10-CM | POA: Diagnosis not present

## 2020-02-06 NOTE — Assessment & Plan Note (Addendum)
Mammogram detected left breast mass with a history of right breast cancer in 2002 treated with lumpectomy chemotherapy and radiation.  The left breast mass measured 2.6 cm.  Biopsy revealed grade 2 invasive ductal carcinoma ER/PR positive HER-2 negative with a Ki-67 of 15%.  Additional biopsies revealed LCIS and CSL.  T2N0 stage Ib  Pathology and radiology counseling:Discussed with the patient, the details of pathology including the type of breast cancer,the clinical staging, the significance of ER, PR and HER-2/neu receptors and the implications for treatment. After reviewing the pathology in detail, we proceeded to discuss the different treatment options between surgery, radiation, chemotherapy, antiestrogen therapies.  Recommendations: 1. Breast conserving surgery followed by 2. Oncotype DX testing to determine if chemotherapy would be of any benefit followed by 3. Adjuvant radiation therapy followed by 4. Adjuvant antiestrogen therapy  Oncotype counseling: I discussed Oncotype DX test. I explained to the patient that this is a 21 gene panel to evaluate patient tumors DNA to calculate recurrence score. This would help determine whether patient has high risk or intermediate risk or low risk breast cancer. She understands that if her tumor was found to be high risk, she would benefit from systemic chemotherapy. If low risk, no need of chemotherapy. If she was found to be intermediate risk, we would need to evaluate the score as well as other risk factors and determine if an abbreviated chemotherapy may be of benefit.  Return to clinic after surgery to discuss final pathology report and then determine if Oncotype DX testing will need to be sent.

## 2020-02-07 ENCOUNTER — Encounter: Payer: Self-pay | Admitting: *Deleted

## 2020-02-07 ENCOUNTER — Other Ambulatory Visit: Payer: Self-pay | Admitting: Surgery

## 2020-02-07 DIAGNOSIS — C50412 Malignant neoplasm of upper-outer quadrant of left female breast: Secondary | ICD-10-CM

## 2020-02-07 DIAGNOSIS — Z170 Estrogen receptor positive status [ER+]: Secondary | ICD-10-CM

## 2020-02-08 ENCOUNTER — Encounter: Payer: Self-pay | Admitting: *Deleted

## 2020-02-09 ENCOUNTER — Telehealth: Payer: Self-pay | Admitting: Hematology and Oncology

## 2020-02-09 ENCOUNTER — Encounter: Payer: Self-pay | Admitting: *Deleted

## 2020-02-09 ENCOUNTER — Ambulatory Visit: Payer: PPO | Admitting: Nurse Practitioner

## 2020-02-09 NOTE — Telephone Encounter (Signed)
Scheduled appt per 6/10 sch message - pt is aware of appt date and time.  

## 2020-02-13 ENCOUNTER — Encounter: Payer: Self-pay | Admitting: *Deleted

## 2020-02-13 ENCOUNTER — Ambulatory Visit
Admission: RE | Admit: 2020-02-13 | Discharge: 2020-02-13 | Disposition: A | Discharge status: Home / Self Care | Payer: PPO | Source: Ambulatory Visit | Attending: Surgery | Admitting: Surgery

## 2020-02-13 ENCOUNTER — Ambulatory Visit: Payer: Self-pay | Admitting: Surgery

## 2020-02-13 DIAGNOSIS — C50912 Malignant neoplasm of unspecified site of left female breast: Secondary | ICD-10-CM

## 2020-02-13 DIAGNOSIS — Z170 Estrogen receptor positive status [ER+]: Secondary | ICD-10-CM

## 2020-02-13 DIAGNOSIS — D0512 Intraductal carcinoma in situ of left breast: Secondary | ICD-10-CM | POA: Diagnosis not present

## 2020-02-13 MED ORDER — GADOBUTROL 1 MMOL/ML IV SOLN
6.0000 mL | Freq: Once | INTRAVENOUS | Status: AC | PRN
  Administered 2020-02-13: 6 mL via INTRAVENOUS

## 2020-02-17 ENCOUNTER — Encounter: Payer: PPO | Attending: Family Medicine | Admitting: Registered"

## 2020-02-17 ENCOUNTER — Encounter: Payer: Self-pay | Admitting: Registered"

## 2020-02-17 ENCOUNTER — Other Ambulatory Visit: Payer: Self-pay

## 2020-02-17 DIAGNOSIS — E119 Type 2 diabetes mellitus without complications: Secondary | ICD-10-CM | POA: Diagnosis not present

## 2020-02-17 NOTE — Progress Notes (Signed)
Diabetes Self-Management Education  Visit Type: First/Initial  Appt. Start Time: 0835 Appt. End Time: 1000  02/17/2020  [name], identified by name and date of birth, is a 74 y.o. female with a diagnosis of Diabetes: Type 2.   ASSESSMENT  There were no vitals taken for this visit. There is no height or weight on file to calculate BMI.   Patient states due to her various health conditions she is experiencing high anxiety and depression. Pt states she does not think counseling will be helpful.   Pt reports some of her medical care has been postponed until her blood sugar is better controlled. Pt states she has made a lot of changes and has cut out most bread, pasta and rice from her diet.   Medication: Pt had started with both Lantus and Novolog, but MD had discontinued Novolog. Pt states in the middle night had a dangerously low blood sugar message on her meter, but meter didn't register a number. Pt's log for last 2 days FBS 66-83 mg/dL RD advised that patient call MD because she probably needs to use less than 10 u Lantus. Pt was hesistant and was going to wait until next appointment, doesn't want to bother MD office.  Pt states she has lost some weight, not intentionally, but because was cutting out some foods to control blood sugar has been eating less. RD encouraged her to not lose more weight prior to surgery and this is a time to build up her body's reserves, not deplete them. RD discussed nutritional supplement that may be helpful for surgery recovery. Pt was not receptive due to cost issue, but then became anxious that she should be doing something special with nutrition to help her future recovery. RD deferred further discussion for her to have with her MD, and to focus on eating foods with more nutrition and avoiding processed food.   Diabetes Self-Management Education - 02/17/20 0903      Visit Information   Visit Type First/Initial      Initial Visit   Diabetes Type  Type 2    Are you currently following a meal plan? Yes    Are you taking your medications as prescribed? Yes    Date Diagnosed April 2021      Health Coping   How would you rate your overall health? Good      Psychosocial Assessment   Patient Belief/Attitude about Diabetes Motivated to manage diabetes    How often do you need to have someone help you when you read instructions, pamphlets, or other written materials from your doctor or pharmacy? 1 - Never    What is the last grade level you completed in school? 12      Complications   Last HgB A1C per patient/outside source 10.6 %    How often do you check your blood sugar? 3-4 times/day    Fasting Blood glucose range (mg/dL) 70-129;<70   66-83   Number of hypoglycemic episodes per month --   often during night   Have you had a dilated eye exam in the past 12 months? Yes    Have you had a dental exam in the past 12 months? Yes    Are you checking your feet? Yes    How many days per week are you checking your feet? 3      Dietary Intake   Breakfast banana, peanut butter OR peach    Snack (morning) fruit    Lunch pita bread, 2  [place] bacon, lettuce, tomato OR [place] hotdogs OR soup    Snack (afternoon) cheese OR cottage cheese OR green olives stuffed with garlic    Dinner 1/2 cob white sweet corn, cube steak, okra OR chicken    Snack (evening) grapes    Beverage(s) try to drink water, diet green tea, zero coke      Exercise   Exercise Type ADL's    How many days per week to you exercise? 0    How many minutes per day do you exercise? 0    Total minutes per week of exercise 0      Patient Education   Previous Diabetes Education No      Outcomes   Expected Outcomes Demonstrated interest in learning. Expect positive outcomes    Future DMSE PRN    Program Status Completed           Individualized Plan for Diabetes Self-Management Training:   Learning Objective:  Patient will have a greater understanding of diabetes  self-management. Patient education plan is to attend individual and/or group sessions per assessed needs and concerns.   Patient Instructions  Contact your MD office regarding your low blood sugar events, especially the dangerously low message you received during the night. You will probably need to lower your insulin dose so work with your doctor's office for adjustment. Aim to eat balanced meals and snacks, whenever eating carbs, also have protein. Use MyPlate as a guide to help create meals. Consider checking your blood sugar 2 hrs after some meals to see how your body handles the food.   Expected Outcomes:  Demonstrated interest in learning. Expect positive outcomes  Education material provided: ADA - How to Thrive: A Guide for Your Journey with Diabetes and Carbohydrate counting sheet  If problems or questions, patient to contact team via:  Phone and MyChart  Future DSME appointment: PRN

## 2020-02-17 NOTE — Patient Instructions (Signed)
Contact your MD office regarding your low blood sugar events, especially the dangerously low message you received during the night. You will probably need to lower your insulin dose so work with your doctor's office for adjustment. Aim to eat balanced meals and snacks, whenever eating carbs, also have protein. Use MyPlate as a guide to help create meals. Consider checking your blood sugar 2 hrs after some meals to see how your body handles the food.

## 2020-02-20 ENCOUNTER — Encounter: Payer: Self-pay | Admitting: *Deleted

## 2020-02-22 DIAGNOSIS — E039 Hypothyroidism, unspecified: Secondary | ICD-10-CM | POA: Diagnosis not present

## 2020-02-22 DIAGNOSIS — Z6823 Body mass index (BMI) 23.0-23.9, adult: Secondary | ICD-10-CM | POA: Diagnosis not present

## 2020-02-22 DIAGNOSIS — C50919 Malignant neoplasm of unspecified site of unspecified female breast: Secondary | ICD-10-CM | POA: Diagnosis not present

## 2020-02-22 DIAGNOSIS — E119 Type 2 diabetes mellitus without complications: Secondary | ICD-10-CM | POA: Diagnosis not present

## 2020-02-28 ENCOUNTER — Ambulatory Visit: Payer: PPO

## 2020-02-28 ENCOUNTER — Institutional Professional Consult (permissible substitution): Payer: PPO | Admitting: Radiation Oncology

## 2020-02-28 DIAGNOSIS — Z853 Personal history of malignant neoplasm of breast: Secondary | ICD-10-CM | POA: Diagnosis not present

## 2020-02-28 NOTE — Pre-Procedure Instructions (Addendum)
[place], [place] - [address] [place] [place] [address] Phone: [phone] Fax: [phone]      Your procedure is scheduled on Thursday 8th.  Report to [facility] Main Entrance "A" at 7:30 A.M., and check in at the Admitting office.  Call this number if you have problems the morning of surgery:  [phone]  Call [phone] if you have any questions prior to your surgery date Monday-Friday 8am-4pm    Remember:  Do not eat after midnight the night before your surgery  You may drink clear liquids until 4:30 the morning of your surgery.   Clear liquids allowed are: Water, Non-Citrus Juices (without pulp), Carbonated Beverages, Clear Tea, Black Coffee Only, and Gatorade    Take these medicines the morning of surgery with A SIP OF WATER   sertraline (ZOLOFT) 100 MG tablet  fluticasone (FLONASE) 50 MCG/ACT nasal spray  levothyroxine (SYNTHROID) 112 MCG tablet   clonazePAM (KLONOPIN) 1 MG tablet as needed for anxiety       As of today, STOP taking any Aspirin (unless otherwise instructed by your surgeon) and Aspirin containing products, Aleve, Naproxen, Ibuprofen, Motrin, Advil, Goody's, BC's, all herbal medications, fish oil, and all vitamins.                      Do not wear jewelry, make up, or nail polish            Do not wear lotions, powders, perfumes, or deodorant.            Do not shave 48 hours prior to surgery.              Do not bring valuables to the hospital.            [facility] is not responsible for any belongings or valuables.  Do NOT Smoke (Tobacco/Vapping) or drink Alcohol 24 hours prior to your procedure If you use a CPAP at night, you may bring all equipment for your overnight stay.   Contacts, glasses, dentures or bridgework may not be worn into surgery.      For patients admitted to the hospital, discharge time will be determined by your treatment team.   Patients discharged the day of  surgery will not be allowed to drive home, and someone needs to stay with them for 24 hours.    Special instructions:   [facility]- Preparing For Surgery  Before surgery, you can play an important role. Because skin is not sterile, your skin needs to be as free of germs as possible. You can reduce the number of germs on your skin by washing with CHG (chlorahexidine gluconate) Soap before surgery.  CHG is an antiseptic cleaner which kills germs and bonds with the skin to continue killing germs even after washing.    Oral Hygiene is also important to reduce your risk of infection.  Remember - BRUSH YOUR TEETH THE MORNING OF SURGERY WITH YOUR REGULAR TOOTHPASTE  Please do not use if you have an allergy to CHG or antibacterial soaps. If your skin becomes reddened/irritated stop using the CHG.  Do not shave (including legs and underarms) for at least 48 hours prior to first CHG shower. It is OK to shave your face.  Please follow these instructions carefully.   1. Shower the NIGHT BEFORE SURGERY and the MORNING OF SURGERY with CHG Soap.   2. If you chose to wash your hair, wash your hair first as  usual with your normal shampoo.  3. After you shampoo, rinse your hair and body thoroughly to remove the shampoo.  4. Use CHG as you would any other liquid soap. You can apply CHG directly to the skin and wash gently with a scrungie or a clean washcloth.   5. Apply the CHG Soap to your body ONLY FROM THE NECK DOWN.  Do not use on open wounds or open sores. Avoid contact with your eyes, ears, mouth and genitals (private parts). Wash Face and genitals (private parts)  with your normal soap.   6. Wash thoroughly, paying special attention to the area where your surgery will be performed.  7. Thoroughly rinse your body with warm water from the neck down.  8. DO NOT shower/wash with your normal soap after using and rinsing off the CHG Soap.  9. Pat yourself dry with a CLEAN TOWEL.  10. Wear CLEAN  PAJAMAS to bed the night before surgery, wear comfortable clothes the morning of surgery  11. Place CLEAN SHEETS on your bed the night of your first shower and DO NOT SLEEP WITH PETS.   Day of Surgery:   Do not apply any deodorants/lotions.  Please wear clean clothes to the hospital/surgery center.   Remember to brush your teeth WITH YOUR REGULAR TOOTHPASTE.   Please read over the following fact sheets that you were given.

## 2020-02-29 ENCOUNTER — Encounter (HOSPITAL_COMMUNITY)
Admission: RE | Admit: 2020-02-29 | Discharge: 2020-02-29 | Disposition: A | Discharge status: Home / Self Care | Payer: PPO | Source: Ambulatory Visit | Attending: Surgery | Admitting: Surgery

## 2020-02-29 ENCOUNTER — Other Ambulatory Visit: Payer: Self-pay

## 2020-02-29 ENCOUNTER — Encounter (HOSPITAL_COMMUNITY): Payer: Self-pay

## 2020-02-29 DIAGNOSIS — Z01818 Encounter for other preprocedural examination: Secondary | ICD-10-CM | POA: Diagnosis not present

## 2020-02-29 HISTORY — DX: Anxiety disorder, unspecified: F41.9

## 2020-02-29 HISTORY — DX: Depression, unspecified: F32.A

## 2020-02-29 HISTORY — DX: Hypothyroidism, unspecified: E03.9

## 2020-02-29 HISTORY — DX: Nausea with vomiting, unspecified: R11.2

## 2020-02-29 HISTORY — DX: Other specified postprocedural states: Z98.890

## 2020-02-29 LAB — CBC WITH DIFFERENTIAL/PLATELET
Abs Immature Granulocytes: 0.01 10*3/uL (ref 0.00–0.07)
Basophils Absolute: 0 10*3/uL (ref 0.0–0.1)
Basophils Relative: 1 %
Eosinophils Absolute: 0.1 10*3/uL (ref 0.0–0.5)
Eosinophils Relative: 1 %
HCT: 41 % (ref 36.0–46.0)
Hemoglobin: 13.7 g/dL (ref 12.0–15.0)
Immature Granulocytes: 0 %
Lymphocytes Relative: 38 %
Lymphs Abs: 2 10*3/uL (ref 0.7–4.0)
MCH: 32.5 pg (ref 26.0–34.0)
MCHC: 33.4 g/dL (ref 30.0–36.0)
MCV: 97.4 fL (ref 80.0–100.0)
Monocytes Absolute: 0.5 10*3/uL (ref 0.1–1.0)
Monocytes Relative: 10 %
Neutro Abs: 2.6 10*3/uL (ref 1.7–7.7)
Neutrophils Relative %: 50 %
Platelets: 245 10*3/uL (ref 150–400)
RBC: 4.21 MIL/uL (ref 3.87–5.11)
RDW: 11.5 % (ref 11.5–15.5)
WBC: 5.2 10*3/uL (ref 4.0–10.5)
nRBC: 0 % (ref 0.0–0.2)

## 2020-02-29 LAB — HEMOGLOBIN A1C
Hgb A1c MFr Bld: 8.5 % — ABNORMAL HIGH (ref 4.8–5.6)
Mean Plasma Glucose: 197.25 mg/dL

## 2020-02-29 LAB — COMPREHENSIVE METABOLIC PANEL
ALT: 14 U/L (ref 0–44)
AST: 17 U/L (ref 15–41)
Albumin: 4 g/dL (ref 3.5–5.0)
Alkaline Phosphatase: 48 U/L (ref 38–126)
Anion gap: 9 (ref 5–15)
BUN: 20 mg/dL (ref 8–23)
CO2: 25 mmol/L (ref 22–32)
Calcium: 9.8 mg/dL (ref 8.9–10.3)
Chloride: 102 mmol/L (ref 98–111)
Creatinine, Ser: 0.75 mg/dL (ref 0.44–1.00)
GFR calc Af Amer: >60 mL/min (ref >60)
GFR calc non Af Amer: >60 mL/min (ref >60)
Glucose, Bld: 198 mg/dL — ABNORMAL HIGH (ref 70–99)
Potassium: 4.3 mmol/L (ref 3.5–5.1)
Sodium: 136 mmol/L (ref 135–145)
Total Bilirubin: 0.6 mg/dL (ref 0.3–1.2)
Total Protein: 7.2 g/dL (ref 6.5–8.1)

## 2020-02-29 LAB — GLUCOSE, CAPILLARY: Glucose-Capillary: 185 mg/dL — ABNORMAL HIGH (ref 70–99)

## 2020-02-29 NOTE — Progress Notes (Signed)
PCP - Dr. Veverly Fells with [facility] Cardiologist - Denies  Chest x-ray - Not indicated EKG - 02/29/20 Stress Test - Denies ECHO - Denies Cardiac Cath - Denies  Sleep Study - Denies OSA  DM - Type I Fasting Blood Sugar - 102-162 average Checks Blood Sugar __8___ times a day CBG 185 at PAT appt  ERAS Protcol - Yes PRE-SURGERY Ensure or G2- G2 given  COVID TEST- July 6th  Anesthesia review: No  Patient denies shortness of breath, fever, cough and chest pain at PAT appointment   All instructions explained to the patient, with a verbal understanding of the material. Patient agrees to go over the instructions while at home for a better understanding. Patient also instructed to self quarantine after being tested for COVID-19. The opportunity to ask questions was provided.

## 2020-02-29 NOTE — Pre-Procedure Instructions (Signed)
[place], [place] - [address] [place] [place] [address] Phone: [phone] Fax: [phone]      Your procedure is scheduled on Thursday 8th.  Report to [facility] Main Entrance "A" at 7:30 A.M., and check in at the Admitting office.  Call this number if you have problems the morning of surgery:  [phone]  Call [phone] if you have any questions prior to your surgery date Monday-Friday 8am-4pm    Remember:  Do not eat or drink after midnight the night before your surgery   Enhanced Recovery after Surgery for Orthopedics Enhanced Recovery after Surgery is a protocol used to improve the stress on your body and your recovery after surgery.  Patient Instructions  . The night before surgery:  o No food after midnight. ONLY clear liquids after midnight  . The day of surgery (if you have diabetes): o  o Drink ONE (1) Gatorade 2 (G2) as directed. o This drink was given to you during your hospital  pre-op appointment visit.  o The pre-op nurse will instruct you on the time to drink the   Gatorade 2 (G2) depending on your surgery time. o Color of the Gatorade may vary. Red is not allowed. o Nothing else to drink after completing the  Gatorade 2 (G2).         If you have questions, please contact your surgeon's office.  You may drink clear liquids until 4:30 the morning of your surgery.   Clear liquids allowed are: Water, Non-Citrus Juices (without pulp), Carbonated Beverages, Clear Tea, Black Coffee Only, and Gatorade   Take these medicines the morning of surgery with A SIP OF WATER    sertraline (ZOLOFT) 100 MG tablet  fluticasone (FLONASE) 50 MCG/ACT nasal spray  levothyroxine (SYNTHROID) 112 MCG tablet    clonazePAM (KLONOPIN) 1 MG tablet   WHAT DO I DO ABOUT MY DIABETES MEDICATION?   Marland Kitchen Do not take oral diabetes medicines (pills) the morning of surgery.  . THE NIGHT BEFORE SURGERY do not take any  Lantus.       . THE MORNING OF SURGERY no insulin of by mouth medication  . The day of surgery, do not take other diabetes injectables, including Byetta (exenatide), Bydureon (exenatide ER), Victoza (liraglutide), or Trulicity (dulaglutide).  . If your CBG is greater than 220 mg/dL, you may take  of your sliding scale (correction) dose of insulin.   HOW TO MANAGE YOUR DIABETES BEFORE AND AFTER SURGERY  Why is it important to control my blood sugar before and after surgery? . Improving blood sugar levels before and after surgery helps healing and can limit problems. . A way of improving blood sugar control is eating a healthy diet by: o  Eating less sugar and carbohydrates o  Increasing activity/exercise o  Talking with your doctor about reaching your blood sugar goals . High blood sugars (greater than 180 mg/dL) can raise your risk of infections and slow your recovery, so you will need to focus on controlling your diabetes during the weeks before surgery. . Make sure that the doctor who takes care of your diabetes knows about your planned surgery including the date and location.  How do I manage my blood sugar before surgery? . Check your blood sugar at least 4 times a day, starting 2 days before surgery, to make sure that the level is not too high or low. . Check your blood sugar the morning of your surgery when you wake  up and every 2 hours until you get to the Short Stay unit. o If your blood sugar is less than 70 mg/dL, you will need to treat for low blood sugar: - Do not take insulin. - Treat a low blood sugar (less than 70 mg/dL) with  cup of clear juice (cranberry or apple), 4 glucose tablets, OR glucose gel. - Recheck blood sugar in 15 minutes after treatment (to make sure it is greater than 70 mg/dL). If your blood sugar is not greater than 70 mg/dL on recheck, call [phone] for further instructions. . Report your blood sugar to the short stay nurse when you get to Short  Stay.  . If you are admitted to the hospital after surgery: o Your blood sugar will be checked by the staff and you will probably be given insulin after surgery (instead of oral diabetes medicines) to make sure you have good blood sugar levels. o The goal for blood sugar control after surgery is 80-180 mg/dL.         As of today, STOP taking any Aspirin (unless otherwise instructed by your surgeon) and Aspirin containing products, Aleve, Naproxen, Ibuprofen, Motrin, Advil, Goody's, BC's, all herbal medications, fish oil, and all vitamins.                      Do not wear jewelry, make up, or nail polish            Do not wear lotions, powders, perfumes/colognes, or deodorant.            Do not shave 48 hours prior to surgery.  Men may shave face and neck.            Do not bring valuables to the hospital.            [facility] is not responsible for any belongings or valuables.  Do NOT Smoke (Tobacco/Vapping) or drink Alcohol 24 hours prior to your procedure If you use a CPAP at night, you may bring all equipment for your overnight stay.   Contacts, glasses, dentures or bridgework may not be worn into surgery.      For patients admitted to the hospital, discharge time will be determined by your treatment team.   Patients discharged the day of surgery will not be allowed to drive home, and someone needs to stay with them for 24 hours.    Special instructions:   [facility]- Preparing For Surgery  Before surgery, you can play an important role. Because skin is not sterile, your skin needs to be as free of germs as possible. You can reduce the number of germs on your skin by washing with CHG (chlorahexidine gluconate) Soap before surgery.  CHG is an antiseptic cleaner which kills germs and bonds with the skin to continue killing germs even after washing.    Oral Hygiene is also important to reduce your risk of infection.  Remember - BRUSH YOUR TEETH THE MORNING OF SURGERY WITH YOUR  REGULAR TOOTHPASTE  Please do not use if you have an allergy to CHG or antibacterial soaps. If your skin becomes reddened/irritated stop using the CHG.  Do not shave (including legs and underarms) for at least 48 hours prior to first CHG shower. It is OK to shave your face.  Please follow these instructions carefully.   1. Shower the NIGHT BEFORE SURGERY and the MORNING OF SURGERY with CHG Soap.   2. If you chose to wash your hair, wash  your hair first as usual with your normal shampoo.  3. After you shampoo, rinse your hair and body thoroughly to remove the shampoo.  4. Use CHG as you would any other liquid soap. You can apply CHG directly to the skin and wash gently with a scrungie or a clean washcloth.   5. Apply the CHG Soap to your body ONLY FROM THE NECK DOWN.  Do not use on open wounds or open sores. Avoid contact with your eyes, ears, mouth and genitals (private parts). Wash Face and genitals (private parts)  with your normal soap.   6. Wash thoroughly, paying special attention to the area where your surgery will be performed.  7. Thoroughly rinse your body with warm water from the neck down.  8. DO NOT shower/wash with your normal soap after using and rinsing off the CHG Soap.  9. Pat yourself dry with a CLEAN TOWEL.  10. Wear CLEAN PAJAMAS to bed the night before surgery, wear comfortable clothes the morning of surgery  11. Place CLEAN SHEETS on your bed the night of your first shower and DO NOT SLEEP WITH PETS.   Day of Surgery:   Do not apply any deodorants/lotions.  Please wear clean clothes to the hospital/surgery center.   Remember to brush your teeth WITH YOUR REGULAR TOOTHPASTE.   Please read over the following fact sheets that you were given.

## 2020-03-01 ENCOUNTER — Encounter (HOSPITAL_COMMUNITY): Payer: Self-pay

## 2020-03-01 DIAGNOSIS — E139 Other specified diabetes mellitus without complications: Secondary | ICD-10-CM | POA: Diagnosis not present

## 2020-03-01 DIAGNOSIS — E039 Hypothyroidism, unspecified: Secondary | ICD-10-CM | POA: Diagnosis not present

## 2020-03-01 DIAGNOSIS — Z6823 Body mass index (BMI) 23.0-23.9, adult: Secondary | ICD-10-CM | POA: Diagnosis not present

## 2020-03-01 DIAGNOSIS — C50919 Malignant neoplasm of unspecified site of unspecified female breast: Secondary | ICD-10-CM | POA: Diagnosis not present

## 2020-03-01 NOTE — Progress Notes (Signed)
Anesthesia Chart Review:  Case: [id number] Date/Time: 03/08/20 0915   Procedure: LEFT BREAST SIMPLE MASTECTOMY WITH LEFT SENTINEL LYMPH NODE MAPPING (Left ) - PEC BLOCK   Anesthesia type: General   Pre-op diagnosis: LEFT BREAST CANCER   Location: [facility] OR ROOM 02 / [place] OR   Surgeons: Erroll Luna, MD     Special Needs: Supine, RNFA, Neoprobe, Diabetic, Nuc Med 7/8/@ 9AM, Pec block   DISCUSSION: Patient is a 74 year old female scheduled for the above procedure.  History includes former smoker, postoperative N/V, DM (diagnosed 2021), hypothyroidism, anxiety, depression, breast cancer (right 2002, s/p right breast lumpectomy, chemoradiation; right retropectoral implant; left breast biopsy x2 01/26/20: lobular carcinoma in situ, fibroadenoma).   According to 01/24/20 office note by PCP Dr. Orland Mustard, she was recently diagnosed with DM with hyperglycemia (A1c 10.6). She was started on Lantus and metformin. She was referred to dietician and DM educator. He notes recent diagnosis of left breast cancer with plans for general surgery evaluation. He also mentions a pending GI referral for abnormal stool test.  A1c on 02/29/20 was 8.5%, but down from 10.6% at the time of DM diagnosis. .  She reports fasting CBGs 102-162. (This was routed to Dr. Brantley Stage.) She will get a fasting CBG on arrival for surgery.  She denied shortness of breath, cough, fever, chest pain at PAT RN visit.  [employer] 11/01/2019.  Preoperative COVID-19 test is scheduled for 03/06/2020.  Anesthesia team to evaluate on the day of surgery.   VS: BP 124/73   Pulse 91   Temp 36.9 C (Oral)   Resp 18   Ht 5\' 4"  (1.626 m)   Wt 58.3 kg   SpO2 100%   BMI 22.06 kg/m     PROVIDERS: London Pepper, MD his PCP at [facility] 01/24/20 office note is scanned under Media tab. Nicholas Lose, MD is HEM-ONC   LABS: Preoperative labs noted.  A1c elevated 8.5%. (all labs ordered are listed, but only abnormal results are  displayed)  Labs Reviewed  GLUCOSE, CAPILLARY - Abnormal; Notable for the following components:      Result Value   Glucose-Capillary 185 (*)    All other components within normal limits  COMPREHENSIVE METABOLIC PANEL - Abnormal; Notable for the following components:   Glucose, Bld 198 (*)    All other components within normal limits  HEMOGLOBIN A1C - Abnormal; Notable for the following components:   Hgb A1c MFr Bld 8.5 (*)    All other components within normal limits  CBC WITH DIFFERENTIAL/PLATELET    EKG: 02/29/20:  Sinus rhythm with short PR (PR 100 ms) Otherwise normal ECG - She has a comparison EKG in [facility] from 06/15/01. Overall, tracing appears stable, PR recorded as 124 ms.   CV: She denied prior stress test, echo, cardiac cath.   Past Medical History:  Diagnosis Date  . Anxiety   . Cancer ([place]) 2021  . Depression   . Diabetes mellitus without complication ([place]) 0254   Type I  . Hypothyroidism   . Personal history of chemotherapy   . Personal history of radiation therapy   . PONV (postoperative nausea and vomiting)     Past Surgical History:  Procedure Laterality Date  . ABDOMINAL HYSTERECTOMY     Partial  . AUGMENTATION MAMMAPLASTY Right   . BREAST LUMPECTOMY Right 11/23/2000  . EYE SURGERY  2021   cataract  . REDUCTION MAMMAPLASTY Left     MEDICATIONS: . Calcium Carb-Cholecalciferol (CALCIUM + D3 PO)  .  Chlorpheniramine-Phenylephrine (RA SUPHEDRINE PE) 4-10 MG tablet  . clonazePAM (KLONOPIN) 1 MG tablet  . Continuous Blood Gluc Receiver (FREESTYLE LIBRE 14 DAY READER) DEVI  . fluticasone (FLONASE) 50 MCG/ACT nasal spray  . LANTUS SOLOSTAR 100 UNIT/ML Solostar Pen  . levothyroxine (SYNTHROID) 112 MCG tablet  . metFORMIN (GLUCOPHAGE) 1000 MG tablet  . Multiple Vitamin (MULTIVITAMIN WITH MINERALS) TABS tablet  . Phenylephrine-Acetaminophen (EQ SUPHEDRINE PE) 5-325 MG TABS  . sertraline (ZOLOFT) 100 MG tablet   No current facility-administered  medications for this encounter.    Myra Gianotti, PA-C Surgical Short Stay/Anesthesiology [facility] Phone [phone] [facility] Phone [phone] 03/01/2020 4:18 PM

## 2020-03-01 NOTE — Anesthesia Preprocedure Evaluation (Addendum)
Anesthesia Evaluation  Patient identified by MRN, date of birth, ID band Patient awake    Reviewed: Allergy & Precautions, H&P , NPO status , Patient's Chart, lab work & pertinent test results  History of Anesthesia Complications (+) PONV  Airway Mallampati: III  TM Distance: >3 FB Neck ROM: Full    Dental no notable dental hx. (+) Teeth Intact, Dental Advisory Given   Pulmonary neg pulmonary ROS, former smoker,    Pulmonary exam normal breath sounds clear to auscultation       Cardiovascular negative cardio ROS   Rhythm:Regular Rate:Normal     Neuro/Psych Anxiety Depression negative neurological ROS     GI/Hepatic negative GI ROS, Neg liver ROS,   Endo/Other  diabetes, Insulin Dependent, Oral Hypoglycemic AgentsHypothyroidism   Renal/GU negative Renal ROS  negative genitourinary   Musculoskeletal   Abdominal   Peds  Hematology negative hematology ROS (+)   Anesthesia Other Findings   Reproductive/Obstetrics negative OB ROS                           Anesthesia Physical Anesthesia Plan  ASA: III  Anesthesia Plan: General   Post-op Pain Management:  Regional for Post-op pain   Induction: Intravenous  PONV Risk Score and Plan: 4 or greater and Ondansetron, Propofol infusion, TIVA and Midazolam  Airway Management Planned: LMA  Additional Equipment:   Intra-op Plan:   Post-operative Plan: Extubation in OR  Informed Consent: I have reviewed the patients History and Physical, chart, labs and discussed the procedure including the risks, benefits and alternatives for the proposed anesthesia with the patient or authorized representative who has indicated his/her understanding and acceptance.     Dental advisory given  Plan Discussed with: CRNA  Anesthesia Plan Comments: (PAT note written by Myra Gianotti, PA-C. Recent diagnosis of DM with A1c 10.6%--02/29/20 A1c 8.5% since  starting metformin and Lantus.  )       Anesthesia Quick Evaluation

## 2020-03-06 ENCOUNTER — Other Ambulatory Visit (HOSPITAL_COMMUNITY)
Admission: RE | Admit: 2020-03-06 | Discharge: 2020-03-06 | Disposition: A | Discharge status: Home / Self Care | Payer: PPO | Source: Ambulatory Visit | Attending: Surgery | Admitting: Surgery

## 2020-03-06 DIAGNOSIS — Z01812 Encounter for preprocedural laboratory examination: Secondary | ICD-10-CM | POA: Diagnosis not present

## 2020-03-06 DIAGNOSIS — Z20822 Contact with and (suspected) exposure to covid-19: Secondary | ICD-10-CM | POA: Insufficient documentation

## 2020-03-06 LAB — SARS CORONAVIRUS 2 (TAT 6-24 HRS): SARS Coronavirus 2: NEGATIVE

## 2020-03-06 NOTE — Progress Notes (Signed)
Mrs. Scinto called asking questions regarding Lantus and surgery. I instructed patient to take 4. 8 units of Lantus the evening before surgery. Patient became very nervous, " that is not what the paper they gave me said, it says to not take Lantus the evening before surgery."  I told patient information on Lantus is incorrect. Mrs Stapleton said that she did not know how to dial 4.8 Units,  I asked if the numbers were whole numbers, patient was could not tell me.  I told Mrs. Fuhrer thaty she could take 4 units of Lantus the evening of surgery. Patient said that she had been adjusting Insulin some and that CBG was 378 yesterday, this am it was 190's. Mrs. Lawrie said , I think I should just cancel surgery, because she didn't know what to do, and I have cancer." I encouraged patient to talk to her endocrinologist about what she would want her to do about Insulin. I asked if I could call her back, that I had to see a patient, she said yes.  I called patient back and she was very calm, she said I spoke with my endocrinologist and she told me to take 4 units of Lantus the evening before and no diabetes medication on the morning of surgery. Patient said she understands what to do now.

## 2020-03-08 ENCOUNTER — Ambulatory Visit (HOSPITAL_COMMUNITY)
Admission: RE | Admit: 2020-03-08 | Discharge: 2020-03-08 | Disposition: A | Discharge status: Home / Self Care | Payer: PPO | Source: Ambulatory Visit | Attending: Surgery | Admitting: Surgery

## 2020-03-08 ENCOUNTER — Other Ambulatory Visit: Payer: Self-pay

## 2020-03-08 ENCOUNTER — Encounter (HOSPITAL_COMMUNITY): Payer: Self-pay | Admitting: Surgery

## 2020-03-08 ENCOUNTER — Ambulatory Visit (HOSPITAL_COMMUNITY): Payer: PPO | Admitting: Vascular Surgery

## 2020-03-08 ENCOUNTER — Encounter (HOSPITAL_COMMUNITY)
Admission: RE | Disposition: A | Discharge status: Home / Self Care | Payer: Self-pay | Source: Home / Self Care | Attending: Surgery

## 2020-03-08 ENCOUNTER — Ambulatory Visit (HOSPITAL_COMMUNITY): Payer: PPO | Admitting: Anesthesiology

## 2020-03-08 ENCOUNTER — Observation Stay (HOSPITAL_COMMUNITY)
Admission: RE | Admit: 2020-03-08 | Discharge: 2020-03-09 | Disposition: A | Discharge status: Home / Self Care | Payer: PPO | Attending: Surgery | Admitting: Surgery

## 2020-03-08 DIAGNOSIS — Z170 Estrogen receptor positive status [ER+]: Secondary | ICD-10-CM | POA: Diagnosis not present

## 2020-03-08 DIAGNOSIS — C50412 Malignant neoplasm of upper-outer quadrant of left female breast: Secondary | ICD-10-CM | POA: Diagnosis not present

## 2020-03-08 DIAGNOSIS — Z794 Long term (current) use of insulin: Secondary | ICD-10-CM | POA: Diagnosis not present

## 2020-03-08 DIAGNOSIS — C50912 Malignant neoplasm of unspecified site of left female breast: Secondary | ICD-10-CM | POA: Diagnosis present

## 2020-03-08 DIAGNOSIS — E119 Type 2 diabetes mellitus without complications: Secondary | ICD-10-CM | POA: Diagnosis not present

## 2020-03-08 DIAGNOSIS — F418 Other specified anxiety disorders: Secondary | ICD-10-CM | POA: Diagnosis not present

## 2020-03-08 DIAGNOSIS — G8918 Other acute postprocedural pain: Secondary | ICD-10-CM | POA: Diagnosis not present

## 2020-03-08 HISTORY — PX: MASTECTOMY W/ SENTINEL NODE BIOPSY: SHX2001

## 2020-03-08 LAB — GLUCOSE, CAPILLARY
Glucose-Capillary: 165 mg/dL — ABNORMAL HIGH (ref 70–99)
Glucose-Capillary: 203 mg/dL — ABNORMAL HIGH (ref 70–99)
Glucose-Capillary: 260 mg/dL — ABNORMAL HIGH (ref 70–99)
Glucose-Capillary: 332 mg/dL — ABNORMAL HIGH (ref 70–99)

## 2020-03-08 SURGERY — MASTECTOMY WITH SENTINEL LYMPH NODE BIOPSY
Anesthesia: General | Site: Breast | Laterality: Left

## 2020-03-08 MED ORDER — CLONAZEPAM 1 MG PO TABS
1.0000 mg | ORAL_TABLET | Freq: Three times a day (TID) | ORAL | Status: DC | PRN
  Administered 2020-03-08: 1 mg via ORAL
  Filled 2020-03-08: qty 1

## 2020-03-08 MED ORDER — INSULIN ASPART 100 UNIT/ML ~~LOC~~ SOLN
0.0000 [IU] | Freq: Three times a day (TID) | SUBCUTANEOUS | Status: DC
  Administered 2020-03-08: 8 [IU] via SUBCUTANEOUS
  Administered 2020-03-08: 5 [IU] via SUBCUTANEOUS
  Filled 2020-03-08: qty 0.15

## 2020-03-08 MED ORDER — PROPOFOL 10 MG/ML IV BOLUS
INTRAVENOUS | Status: AC
  Filled 2020-03-08: qty 20

## 2020-03-08 MED ORDER — ENOXAPARIN SODIUM 40 MG/0.4ML ~~LOC~~ SOLN
40.0000 mg | SUBCUTANEOUS | Status: DC
  Administered 2020-03-09: 40 mg via SUBCUTANEOUS
  Filled 2020-03-08: qty 0.4

## 2020-03-08 MED ORDER — BUPIVACAINE LIPOSOME 1.3 % IJ SUSP
INTRAMUSCULAR | Status: DC | PRN
  Administered 2020-03-08: 10 mL

## 2020-03-08 MED ORDER — TECHNETIUM TC 99M SULFUR COLLOID FILTERED
1.0000 | Freq: Once | INTRAVENOUS | Status: AC | PRN
  Administered 2020-03-08: 1 via INTRADERMAL

## 2020-03-08 MED ORDER — ORAL CARE MOUTH RINSE: 15.0000 mL | Freq: Once | OROMUCOSAL | Status: AC

## 2020-03-08 MED ORDER — MIDAZOLAM HCL 2 MG/2ML IJ SOLN
INTRAMUSCULAR | Status: AC
  Administered 2020-03-08: 2 mg via INTRAVENOUS
  Filled 2020-03-08: qty 2

## 2020-03-08 MED ORDER — GABAPENTIN 300 MG PO CAPS
300.0000 mg | ORAL_CAPSULE | ORAL | Status: AC
  Administered 2020-03-08: 300 mg via ORAL
  Filled 2020-03-08: qty 1

## 2020-03-08 MED ORDER — CHLORHEXIDINE GLUCONATE CLOTH 2 % EX PADS: 6.0000 | MEDICATED_PAD | Freq: Once | CUTANEOUS | Status: DC

## 2020-03-08 MED ORDER — METHYLENE BLUE 0.5 % INJ SOLN
INTRAVENOUS | Status: AC
  Filled 2020-03-08: qty 10

## 2020-03-08 MED ORDER — ACETAMINOPHEN 325 MG PO TABS: 650.0000 mg | ORAL_TABLET | Freq: Four times a day (QID) | ORAL | Status: DC | PRN

## 2020-03-08 MED ORDER — METHOCARBAMOL 500 MG PO TABS: 500.0000 mg | ORAL_TABLET | Freq: Four times a day (QID) | ORAL | Status: DC | PRN

## 2020-03-08 MED ORDER — MIDAZOLAM HCL 2 MG/2ML IJ SOLN: 2.0000 mg | Freq: Once | INTRAMUSCULAR | Status: AC

## 2020-03-08 MED ORDER — 0.9 % SODIUM CHLORIDE (POUR BTL) OPTIME
TOPICAL | Status: DC | PRN
  Administered 2020-03-08 (×2): 1000 mL

## 2020-03-08 MED ORDER — TRAMADOL HCL 50 MG PO TABS: 50.0000 mg | ORAL_TABLET | Freq: Four times a day (QID) | ORAL | Status: DC | PRN

## 2020-03-08 MED ORDER — ONDANSETRON 4 MG PO TBDP: 4.0000 mg | ORAL_TABLET | Freq: Four times a day (QID) | ORAL | Status: DC | PRN

## 2020-03-08 MED ORDER — CEFAZOLIN SODIUM-DEXTROSE 2-3 GM-%(50ML) IV SOLR
INTRAVENOUS | Status: DC | PRN
  Administered 2020-03-08: 2 g via INTRAVENOUS

## 2020-03-08 MED ORDER — ACETAMINOPHEN 650 MG RE SUPP: 650.0000 mg | Freq: Four times a day (QID) | RECTAL | Status: DC | PRN

## 2020-03-08 MED ORDER — FENTANYL CITRATE (PF) 100 MCG/2ML IJ SOLN
25.0000 ug | INTRAMUSCULAR | Status: DC | PRN
  Administered 2020-03-08: 25 ug via INTRAVENOUS

## 2020-03-08 MED ORDER — GABAPENTIN 300 MG PO CAPS
300.0000 mg | ORAL_CAPSULE | Freq: Two times a day (BID) | ORAL | Status: DC
  Administered 2020-03-08 – 2020-03-09 (×3): 300 mg via ORAL
  Filled 2020-03-08 (×3): qty 1

## 2020-03-08 MED ORDER — DEXTROSE 5 % IV SOLN
3.0000 g | INTRAVENOUS | Status: DC
  Filled 2020-03-08: qty 3000

## 2020-03-08 MED ORDER — LACTATED RINGERS IV SOLN: INTRAVENOUS | Status: DC

## 2020-03-08 MED ORDER — STERILE WATER FOR IRRIGATION IR SOLN
Status: DC | PRN
  Administered 2020-03-08: 1000 mL

## 2020-03-08 MED ORDER — PHENYLEPHRINE HCL-NACL 10-0.9 MG/250ML-% IV SOLN
INTRAVENOUS | Status: DC | PRN
Start: 2020-03-08 — End: 2020-03-08
  Administered 2020-03-08: 30 ug/min via INTRAVENOUS

## 2020-03-08 MED ORDER — METOPROLOL TARTRATE 5 MG/5ML IV SOLN: 5.0000 mg | Freq: Four times a day (QID) | INTRAVENOUS | Status: DC | PRN

## 2020-03-08 MED ORDER — DEXTROSE 5 % IV SOLN: 3.0000 g | INTRAVENOUS | Status: DC

## 2020-03-08 MED ORDER — ONDANSETRON HCL 4 MG/2ML IJ SOLN
INTRAMUSCULAR | Status: DC | PRN
  Administered 2020-03-08: 4 mg via INTRAVENOUS

## 2020-03-08 MED ORDER — FLUTICASONE PROPIONATE 50 MCG/ACT NA SUSP
2.0000 | Freq: Every day | NASAL | Status: DC
  Filled 2020-03-08: qty 16

## 2020-03-08 MED ORDER — INSULIN ASPART 100 UNIT/ML ~~LOC~~ SOLN
4.0000 [IU] | Freq: Three times a day (TID) | SUBCUTANEOUS | Status: DC
  Administered 2020-03-09: 4 [IU] via SUBCUTANEOUS

## 2020-03-08 MED ORDER — HYDROCODONE-ACETAMINOPHEN 5-325 MG PO TABS: 1.0000 | ORAL_TABLET | ORAL | Status: DC | PRN

## 2020-03-08 MED ORDER — BUPIVACAINE-EPINEPHRINE (PF) 0.5% -1:200000 IJ SOLN
INTRAMUSCULAR | Status: DC | PRN
  Administered 2020-03-08: 20 mL

## 2020-03-08 MED ORDER — PROPOFOL 10 MG/ML IV BOLUS
INTRAVENOUS | Status: DC | PRN
  Administered 2020-03-08: 130 mg via INTRAVENOUS

## 2020-03-08 MED ORDER — CEFAZOLIN SODIUM-DEXTROSE 2-4 GM/100ML-% IV SOLN
2.0000 g | Freq: Three times a day (TID) | INTRAVENOUS | Status: AC
  Administered 2020-03-08: 2 g via INTRAVENOUS
  Filled 2020-03-08: qty 100

## 2020-03-08 MED ORDER — HYDROMORPHONE HCL 1 MG/ML IJ SOLN: 1.0000 mg | INTRAMUSCULAR | Status: DC | PRN

## 2020-03-08 MED ORDER — HYDROCODONE-ACETAMINOPHEN 5-325 MG PO TABS
1.0000 | ORAL_TABLET | Freq: Four times a day (QID) | ORAL | 0 refills | Status: DC | PRN
Start: 2020-03-08 — End: 2020-03-26

## 2020-03-08 MED ORDER — FENTANYL CITRATE (PF) 100 MCG/2ML IJ SOLN: 100.0000 ug | Freq: Once | INTRAMUSCULAR | Status: AC

## 2020-03-08 MED ORDER — INSULIN GLARGINE 100 UNIT/ML SOLOSTAR PEN: 6.0000 [IU] | PEN_INJECTOR | Freq: Every day | SUBCUTANEOUS | Status: DC

## 2020-03-08 MED ORDER — PHENYLEPHRINE 40 MCG/ML (10ML) SYRINGE FOR IV PUSH (FOR BLOOD PRESSURE SUPPORT)
PREFILLED_SYRINGE | INTRAVENOUS | Status: DC | PRN
  Administered 2020-03-08: 80 ug via INTRAVENOUS

## 2020-03-08 MED ORDER — DEXMEDETOMIDINE HCL IN NACL 200 MCG/50ML IV SOLN
INTRAVENOUS | Status: DC | PRN
  Administered 2020-03-08: 12 ug via INTRAVENOUS
  Administered 2020-03-08: 8 ug via INTRAVENOUS

## 2020-03-08 MED ORDER — INSULIN ASPART 100 UNIT/ML ~~LOC~~ SOLN
SUBCUTANEOUS | Status: AC
  Filled 2020-03-08: qty 1

## 2020-03-08 MED ORDER — PROPOFOL 500 MG/50ML IV EMUL
INTRAVENOUS | Status: DC | PRN
  Administered 2020-03-08: 200 ug/kg/min via INTRAVENOUS

## 2020-03-08 MED ORDER — ONDANSETRON HCL 4 MG/2ML IJ SOLN: 4.0000 mg | Freq: Four times a day (QID) | INTRAMUSCULAR | Status: DC | PRN

## 2020-03-08 MED ORDER — IBUPROFEN 800 MG PO TABS
800.0000 mg | ORAL_TABLET | Freq: Three times a day (TID) | ORAL | 0 refills | Status: DC | PRN
Start: 2020-03-08 — End: 2020-03-26

## 2020-03-08 MED ORDER — DEXAMETHASONE SODIUM PHOSPHATE 4 MG/ML IJ SOLN
INTRAMUSCULAR | Status: DC | PRN
  Administered 2020-03-08: 10 mg via INTRAVENOUS

## 2020-03-08 MED ORDER — INSULIN GLARGINE 100 UNIT/ML ~~LOC~~ SOLN
6.0000 [IU] | Freq: Every day | SUBCUTANEOUS | Status: DC
  Administered 2020-03-08: 6 [IU] via SUBCUTANEOUS
  Filled 2020-03-08 (×2): qty 0.06

## 2020-03-08 MED ORDER — FENTANYL CITRATE (PF) 100 MCG/2ML IJ SOLN
INTRAMUSCULAR | Status: AC
  Filled 2020-03-08: qty 2

## 2020-03-08 MED ORDER — CHLORHEXIDINE GLUCONATE 0.12 % MT SOLN
15.0000 mL | Freq: Once | OROMUCOSAL | Status: AC
  Administered 2020-03-08: 15 mL via OROMUCOSAL
  Filled 2020-03-08: qty 15

## 2020-03-08 MED ORDER — FENTANYL CITRATE (PF) 100 MCG/2ML IJ SOLN
INTRAMUSCULAR | Status: AC
  Administered 2020-03-08: 100 ug via INTRAVENOUS
  Filled 2020-03-08: qty 2

## 2020-03-08 MED ORDER — SERTRALINE HCL 100 MG PO TABS
200.0000 mg | ORAL_TABLET | Freq: Every day | ORAL | Status: DC
  Administered 2020-03-09: 200 mg via ORAL
  Filled 2020-03-08: qty 2

## 2020-03-08 MED ORDER — DEXTROSE-NACL 5-0.9 % IV SOLN: INTRAVENOUS | Status: DC

## 2020-03-08 MED ORDER — CELECOXIB 200 MG PO CAPS
200.0000 mg | ORAL_CAPSULE | Freq: Two times a day (BID) | ORAL | Status: DC
  Administered 2020-03-08 – 2020-03-09 (×2): 200 mg via ORAL
  Filled 2020-03-08 (×4): qty 1

## 2020-03-08 MED ORDER — ACETAMINOPHEN 500 MG PO TABS
1000.0000 mg | ORAL_TABLET | ORAL | Status: AC
  Administered 2020-03-08: 1000 mg via ORAL
  Filled 2020-03-08: qty 2

## 2020-03-08 MED ORDER — INSULIN ASPART 100 UNIT/ML ~~LOC~~ SOLN
0.0000 [IU] | Freq: Three times a day (TID) | SUBCUTANEOUS | Status: DC
  Administered 2020-03-09: 11 [IU] via SUBCUTANEOUS

## 2020-03-08 SURGICAL SUPPLY — 52 items
ADH SKN CLS APL DERMABOND .7 (GAUZE/BANDAGES/DRESSINGS) ×1
APL PRP STRL LF DISP 70% ISPRP (MISCELLANEOUS) ×1
APPLIER CLIP 9.375 MED OPEN (MISCELLANEOUS) ×2
APR CLP MED 9.3 20 MLT OPN (MISCELLANEOUS) ×1
BINDER BREAST LRG (GAUZE/BANDAGES/DRESSINGS) ×1 IMPLANT
BINDER BREAST XLRG (GAUZE/BANDAGES/DRESSINGS) IMPLANT
BIOPATCH RED 1 DISK 7.0 (GAUZE/BANDAGES/DRESSINGS) ×1 IMPLANT
CANISTER SUCT 3000ML PPV (MISCELLANEOUS) ×2 IMPLANT
CHLORAPREP W/TINT 26 (MISCELLANEOUS) ×2 IMPLANT
CLIP APPLIE 9.375 MED OPEN (MISCELLANEOUS) ×1 IMPLANT
CNTNR URN SCR LID CUP LEK RST (MISCELLANEOUS) ×1 IMPLANT
CONT SPEC 4OZ STRL OR WHT (MISCELLANEOUS) ×2
COVER PROBE W GEL 5X96 (DRAPES) ×2 IMPLANT
COVER SURGICAL LIGHT HANDLE (MISCELLANEOUS) ×2 IMPLANT
COVER WAND RF STERILE (DRAPES) ×1 IMPLANT
DERMABOND ADVANCED (GAUZE/BANDAGES/DRESSINGS) ×1
DERMABOND ADVANCED .7 DNX12 (GAUZE/BANDAGES/DRESSINGS) ×1 IMPLANT
DRAIN CHANNEL 19F RND (DRAIN) ×3 IMPLANT
DRAPE LAPAROSCOPIC ABDOMINAL (DRAPES) ×2 IMPLANT
DRSG TEGADERM 2-3/8X2-3/4 SM (GAUZE/BANDAGES/DRESSINGS) ×1 IMPLANT
ELECT REM PT RETURN 9FT ADLT (ELECTROSURGICAL) ×2
ELECTRODE REM PT RTRN 9FT ADLT (ELECTROSURGICAL) ×1 IMPLANT
EVACUATOR SILICONE 100CC (DRAIN) ×3 IMPLANT
GAUZE SPONGE 4X4 12PLY STRL (GAUZE/BANDAGES/DRESSINGS) ×1 IMPLANT
GLOVE BIO SURGEON STRL SZ8 (GLOVE) ×2 IMPLANT
GLOVE BIOGEL PI IND STRL 8 (GLOVE) ×1 IMPLANT
GLOVE BIOGEL PI INDICATOR 8 (GLOVE) ×1
GOWN STRL REUS W/ TWL LRG LVL3 (GOWN DISPOSABLE) ×3 IMPLANT
GOWN STRL REUS W/ TWL XL LVL3 (GOWN DISPOSABLE) ×1 IMPLANT
GOWN STRL REUS W/TWL LRG LVL3 (GOWN DISPOSABLE) ×6
GOWN STRL REUS W/TWL XL LVL3 (GOWN DISPOSABLE) ×2
KIT BASIN OR (CUSTOM PROCEDURE TRAY) ×2 IMPLANT
KIT TURNOVER KIT B (KITS) ×2 IMPLANT
NDL 18GX1X1/2 (RX/OR ONLY) (NEEDLE) ×1 IMPLANT
NDL FILTER BLUNT 18X1 1/2 (NEEDLE) IMPLANT
NDL HYPO 25GX1X1/2 BEV (NEEDLE) ×1 IMPLANT
NEEDLE 18GX1X1/2 (RX/OR ONLY) (NEEDLE) ×2 IMPLANT
NEEDLE FILTER BLUNT 18X 1/2SAF (NEEDLE)
NEEDLE FILTER BLUNT 18X1 1/2 (NEEDLE) IMPLANT
NEEDLE HYPO 25GX1X1/2 BEV (NEEDLE) ×2 IMPLANT
NS IRRIG 1000ML POUR BTL (IV SOLUTION) ×2 IMPLANT
PACK GENERAL/GYN (CUSTOM PROCEDURE TRAY) ×2 IMPLANT
PAD ABD 8X10 STRL (GAUZE/BANDAGES/DRESSINGS) ×1 IMPLANT
PAD ARMBOARD 7.5X6 YLW CONV (MISCELLANEOUS) ×2 IMPLANT
PENCIL SMOKE EVACUATOR (MISCELLANEOUS) ×2 IMPLANT
SPECIMEN JAR X LARGE (MISCELLANEOUS) ×2 IMPLANT
SUT ETHILON 3 0 FSL (SUTURE) ×2 IMPLANT
SUT MNCRL AB 4-0 PS2 18 (SUTURE) ×3 IMPLANT
SUT VIC AB 3-0 SH 18 (SUTURE) ×2 IMPLANT
SYR CONTROL 10ML LL (SYRINGE) ×1 IMPLANT
TOWEL GREEN STERILE (TOWEL DISPOSABLE) ×2 IMPLANT
TOWEL GREEN STERILE FF (TOWEL DISPOSABLE) ×2 IMPLANT

## 2020-03-08 NOTE — Anesthesia Postprocedure Evaluation (Signed)
Anesthesia Post Note  Patient: Blu L Minney  Procedure(s) Performed: LEFT BREAST SIMPLE MASTECTOMY WITH LEFT SENTINEL LYMPH NODE MAPPING (Left Breast)     Patient location during evaluation: PACU Anesthesia Type: General and Regional Level of consciousness: awake and alert Pain management: pain level controlled Vital Signs Assessment: post-procedure vital signs reviewed and stable Respiratory status: spontaneous breathing, nonlabored ventilation and respiratory function stable Cardiovascular status: blood pressure returned to baseline and stable Postop Assessment: no apparent nausea or vomiting Anesthetic complications: no   No complications documented.  Last Vitals:  Vitals:   03/08/20 1200 03/08/20 1230  BP: (!) 106/58 120/66  Pulse: 62 66  Resp: 16 16  Temp:  36.9 C  SpO2: 96% 97%    Last Pain:  Vitals:   03/08/20 1215  TempSrc:   PainSc: 2                  Luvern Mischke,W. EDMOND

## 2020-03-08 NOTE — H&P (Signed)
Patty Lopez  Location: [facility] Patient #: [id number] DOB: 1946/02/26 Undefined / Language: [name] / Race: White Female  History of Present IllnessPatient words: Patient presents for evaluation of left breast mass detected by mammogram. History of right breast cancer in 2002 treated with breast conserving surgery and postoperative chemotherapy and radiation therapy. She was found to have a left breast mass outer quadrant upper outer quadrant measuring 2.6 cm. Core biopsy showed grade 2 invasive ductal carcinoma as to receptor positive progesterone receptor positive her 2 neu  Negative with a KI 67 at 15%   she is interested in breast conserving surgery.  She had additional biopsy which showed lobular carcinoma in situ and a fibroadenomatoid change and fibroadenoma. Final pathology is not back and E cadherin is pending.    ADDITIONAL INFORMATION: 1. PROGNOSTIC INDICATORS Results: IMMUNOHISTOCHEMICAL AND MORPHOMETRIC ANALYSIS PERFORMED MANUALLY The tumor cells are NEGATIVE for Her2 (1+). Estrogen Receptor: 100%, POSITIVE, STRONG STAINING INTENSITY Progesterone Receptor: 100%, POSITIVE, STRONG STAINING INTENSITY Proliferation Marker Ki67: 12% REFERENCE RANGE ESTROGEN RECEPTOR NEGATIVE 0% POSITIVE =>1% REFERENCE RANGE PROGESTERONE RECEPTOR NEGATIVE 0% POSITIVE =>1% All controls stained appropriately Enid Cutter MD Pathologist, Electronic Signature ( Signed 01/23/2020) 1. E-cadherin is negative consistent with a lobular phenotype. Vicente Males MD Pathologist, Electronic Signature ( Signed 01/20/2020) 1 of 3 FINAL for [place], [place] [phone]) FINAL DIAGNOSIS Diagnosis 1. Breast, left, needle core biopsy, 2 o'clock - INVASIVE MAMMARY CARCINOMA, SEE COMMENT. - MAMMARY CARCINOMA IN SITU. 2. Breast, left, needle core biopsy, upper inner - FIBROADENOMATOID CHANGE WITH CALCIFICATIONS. - NO MALIGNANCY IDENTIFIED. Microscopic Comment 1. The  carcinoma appears grade 2 and measures 10 mm in greatest linear extent. E-cadherin will be ordered. Prognostic makers will be ordered. Dr. Jeannie Done has reviewed the case. The case was called to [facility] on 01/20/2020.                  History of right breast cancer status post lumpectomy in 2002. The patient complains of a palpable abnormality in the left breast.  EXAM: DIGITAL DIAGNOSTIC BILATERAL MAMMOGRAM WITH IMPLANTS, CAD AND TOMO  ULTRASOUND LEFT BREAST  The patient has a right retropectoral implant.  COMPARISON: None.  ACR Breast Density Category c: The breast tissue is heterogeneously dense, which may obscure small masses.  FINDINGS: Lumpectomy changes are seen in the right breast. A retropectoral implant is identified on the right MLO view. No suspicious mass or malignant type microcalcifications identified in the right breast.  There is a spiculated mass associated with coarse heterogeneous calcifications in the upper-outer quadrant of the left breast. There are grouped coarse heterogeneous calcifications in the upper inner quadrant the right breast spanning 1.6 cm.  Mammographic images were processed with CAD.  On physical exam, I palpate a discrete mass in the left breast at 2 o'clock 4 cm from the nipple.  Targeted ultrasound is performed, showing an irregular hypoechoic mass in the left breast at 2 o'clock 4 cm from the nipple measuring 2.6 x 1.6 x 2.3 cm. Sonographic evaluation the left axilla does not show any enlarged adenopathy.  IMPRESSION: Suspicious irregular mass in the 2 o'clock region of the left breast. Indeterminate calcifications in the upper inner quadrant of the left breast.  RECOMMENDATION: Ultrasound-guided core biopsy of the mass in the upper-outer quadrant of the left breast and stereotactic biopsy of the calcifications in the upper-inner quadrant of the left breast  is recommended.  I have discussed the findings and recommendations with the  patient. If applicable, a reminder letter will be sent to the patient regarding the next appointment.  BI-RADS CATEGORY 5: Highly suggestive of malignancy.   Electronically Signed By: Lillia Mountain M.D. On: 01/16/2020 14:18    Diagnosis 1. Breast, left, needle core biopsy, central posterior (x clip) - LOBULAR NEOPLASIA (LOBULAR CARCINOMA IN SITU). - COMPLEX SCLEROSING LESION WITH CALCIFICATIONS. - SEE COMMENT. 2. Breast, left, needle core biopsy, upper central - FIBROADENOMA WITH CALCIFICATIONS. - VASCULAR CALCIFICATIONS. - THERE IS NO EVIDENCE OF MALIGNANCY. Microscopic.  The patient is a 74 year old female.   Past Surgical History Breast Biopsy Right.  Allergies  No Known Allergies [01/27/2020]: Allergies Reconciled  Medication History clonazePAM (1MG Tablet, Oral) Active. Fluticasone Propionate (50MCG/ACT Suspension, Nasal) Active. Lantus SoloStar (100UNIT/ML Soln Pen-inj, Subcutaneous) Active. Levothyroxine Sodium (112MCG Tablet, Oral) Active. metFORMIN HCl (1000MG Tablet, Oral) Active. Sertraline HCl (100MG Tablet, Oral) Active. Multivitamin (Oral) Active. Calcium 500/Vitamin D (500-125MG-UNIT Tablet, Oral) Active. Medications Reconciled  Social History Caffeine use Carbonated beverages, Coffee. Tobacco use Former smoker.  Pregnancy / Birth History Age at menarche 48 years. Age of men[age]  Other Problems Anxiety Disorder     Review of SystemsHEENT Present- Seasonal Allergies and Wears glasses/contact lenses. Not Present- Earache, Hearing Loss, Hoarseness, Nose Bleed, Oral Ulcers, Ringing in the Ears, Sinus Pain, Sore Throat, Visual Disturbances and Yellow Eyes.  Vitals  01/27/2020 10:00 AM Weight: 134.38 lb Height: 63in Body Surface Area: 1.63 m Body Mass Index: 23.8 kg/m  Temp.: 97.3F  Pulse: 85 (Regular)   P.OX: 98% (Room air) BP: 132/78(Sitting, Left Arm, Standard)        Physical Exam  General Mental Status-Alert. General Appearance-Consistent with stated age. Hydration-Well hydrated. Voice-Normal.  Head and Neck Head-normocephalic, atraumatic with no lesions or palpable masses. Trachea-midline. Thyroid Gland Characteristics - normal size and consistency.  Chest and Lung Exam Chest and lung exam reveals -quiet, even and easy respiratory effort with no use of accessory muscles and on auscultation, normal breath sounds, no adventitious sounds and normal vocal resonance. Inspection Chest Wall - Normal. Back - normal.  Breast Note: Right breast shows postlumpectomy changes. No mass. Mild radiation change with mild cosmetic deformity noted. Left breast is bruised. There is some erythema which appears to be secondary from the biopsy and/or prep. There are multiple Band-Aids and Steri-Strips on the left breast. Left breast is swollen post biopsy.  Neurologic Neurologic evaluation reveals -alert and oriented x 3 with no impairment of recent or remote memory. Mental Status-Normal.  Neuropsychiatric Examination of related systems reveals-The patient is well-nourished and well-groomed. The patient's mood and affect are described as -anxious. Associations-circumstantial and loose.  Musculoskeletal Normal Exam - Left-Upper Extremity Strength Normal and Lower Extremity Strength Normal. Normal Exam - Right-Upper Extremity Strength Normal and Lower Extremity Strength Normal.  Lymphatic Head & Neck  General Head & Neck Lymphatics: Bilateral - Description - Normal. Axillary  General Axillary Region: Bilateral - Description - Normal. Tenderness - Non Tender.    Assessment & Plan  BREAST CANCER, LEFT (C50.912) Impression: Await final pathology  If this is lobular, MRI may be helpful  Otherwise, plan on doing left breast seed  localized lumpectomy 2 to target the malignancy in the LCIS. The other 2 areas are benign. She will require sentinel lymph node mapping as well which we've discussed. She has had almost done before surgery she is familiar with the surgery. She does have some memory issues it appears talking with her today but her husband was present  and all questions were answered. Referral to medical and radiation oncology.    Risk of lumpectomy include bleeding, infection, seroma, more surgery, use of seed/wire, wound care, cosmetic deformity and the need for other treatments, death , blood clots, death. Pt agrees to proceed.    Total time 45 minutes  Current Plans You are being scheduled for surgery- Our schedulers will call you.  You should hear from our office's scheduling department within 5 working days about the location, date, and time of surgery. We try to make accommodations for patient's preferences in scheduling surgery, but sometimes the OR schedule or the surgeon's schedule prevents [place] from making those accommodations.  If you have not heard from our office [phone]) in 5 working days, call the office and ask for your surgeon's nurse.  If you have other questions about your diagnosis, plan, or surgery, call the office and ask for your surgeon's nurse.  Pt Education - [facility] Breast Cancer Information Given - [facility] "Breast Journey" Package We discussed the staging and pathophysiology of breast cancer. We discussed all of the different options for treatment for breast cancer including surgery, chemotherapy, radiation therapy, Herceptin, and antiestrogen therapy. We discussed a sentinel lymph node biopsy as she does not appear to having lymph node involvement right now. We discussed the performance of that with injection of radioactive tracer and blue dye. We discussed that she would have an incision underneath her axillary hairline. We discussed that there is a bout a 10-20% chance  of having a positive node with a sentinel lymph node biopsy and we will await the permanent pathology to make any other first further decisions in terms of her treatment. One of these options might be to return to the operating room to perform an axillary lymph node dissection. We discussed about a 1-2% risk lifetime of chronic shoulder pain as well as lymphedema associated with a sentinel lymph node biopsy. We discussed the options for treatment of the breast cancer which included lumpectomy versus a mastectomy. We discussed the performance of the lumpectomy with a wire placement. We discussed a 10-20% chance of a positive margin requiring reexcision in the operating room. We also discussed that she may need radiation therapy or antiestrogen therapy or both if she undergoes lumpectomy. We discussed the mastectomy and the postoperative care for that as well. We discussed that there is no difference in her survival whether she undergoes lumpectomy with radiation therapy or antiestrogen therapy versus a mastectomy. There is a slight difference in the local recurrence rate being 3-5% with lumpectomy and about 1% with a mastectomy. We discussed the risks of operation including bleeding, infection, possible reoperation. She understands her further therapy will be based on what her stages at the time of her operation.

## 2020-03-08 NOTE — Interval H&P Note (Signed)
History and Physical Interval Note:  03/08/2020 8:43 AM  Patty Lopez  has presented today for surgery, with the diagnosis of LEFT BREAST CANCER.  The various methods of treatment have been discussed with the patient and family. After consideration of risks, benefits and other options for treatment, the patient has consented to  Procedure(s) with comments: LEFT BREAST SIMPLE MASTECTOMY WITH LEFT SENTINEL [place] (Left) - PEC BLOCK as a surgical intervention.  The patient's history has been reviewed, patient examined, no change in status, stable for surgery.  I have reviewed the patient's chart and labs.  Questions were answered to the patient's satisfaction.     Turner Daniels MD

## 2020-03-08 NOTE — Discharge Instructions (Signed)
[facility] °[phone] ° °MASTECTOMY: POST OP INSTRUCTIONS ° °Always review your discharge instruction sheet given to you by the facility where your surgery was performed. °IF YOU HAVE DISABILITY OR FAMILY LEAVE FORMS, YOU MUST BRING THEM TO THE OFFICE FOR PROCESSING.   °DO NOT GIVE THEM TO YOUR DOCTOR. °A prescription for pain medication may be given to you upon discharge.  Take your pain medication as prescribed, if needed.  If narcotic pain medicine is not needed, then you may take acetaminophen (Tylenol) or ibuprofen (Advil) as needed. °1. Take your usually prescribed medications unless otherwise directed. °2. If you need a refill on your pain medication, please contact your pharmacy.  They will contact our office to request authorization.  Prescriptions will not be filled after 5pm or on week-ends. °3. You should follow a light diet the first few days after arrival home, such as soup and crackers, etc.  Resume your normal diet the day after surgery. °4. Most patients will experience some swelling and bruising on the chest and underarm.  Ice packs will help.  Swelling and bruising can take several days to resolve.  °5. It is common to experience some constipation if taking pain medication after surgery.  Increasing fluid intake and taking a stool softener (such as Colace) will usually help or prevent this problem from occurring.  A mild laxative (Milk of Magnesia or Miralax) should be taken according to package instructions if there are no bowel movements after 48 hours. °6. Unless discharge instructions indicate otherwise, leave your bandage dry and in place until your next appointment in 3-5 days.  You may take a limited sponge bath.  No tube baths or showers until the drains are removed.  You may have steri-strips (small skin tapes) in place directly over the incision.  These strips should be left on the skin for 7-10 days.  If your surgeon used skin glue on the incision, you may  shower in 24 hours.  The glue will flake off over the next 2-3 weeks.  Any sutures or staples will be removed at the office during your follow-up visit. °7. DRAINS:  If you have drains in place, it is important to keep a list of the amount of drainage produced each day in your drains.  Before leaving the hospital, you should be instructed on drain care.  Call our office if you have any questions about your drains. °8. ACTIVITIES:  You may resume regular (light) daily activities beginning the next day--such as daily self-care, walking, climbing stairs--gradually increasing activities as tolerated.  You may have sexual intercourse when it is comfortable.  Refrain from any heavy lifting or straining until approved by your doctor. °a. You may drive when you are no longer taking prescription pain medication, you can comfortably wear a seatbelt, and you can safely maneuver your car and apply brakes. °b. RETURN TO WORK:  __________________________________________________________ °9. You should see your doctor in the office for a follow-up appointment approximately 3-5 days after your surgery.  Your doctor’s nurse will typically make your follow-up appointment when she calls you with your pathology report.  Expect your pathology report 2-3 business days after your surgery.  You may call to check if you do not hear from us after three days.   °10. OTHER INSTRUCTIONS: ______________________________________________________________________________________________ ____________________________________________________________________________________________ °WHEN TO CALL YOUR DOCTOR: °1. Fever over 101.0 °2. Nausea and/or vomiting °3. Extreme swelling or bruising °4. Continued bleeding from incision. °5. Increased pain, redness, or drainage from the incision. °  The clinic staff is available to answer your questions during regular business hours.  Please don’t hesitate to call and ask to speak to one of the nurses for clinical  concerns.  If you have a medical emergency, go to the nearest emergency room or call 911.  A surgeon from [facility] is always on call at the hospital. °[address], [address], [place], [address]  [address] ? [address] [address], [place], [place]   [address] °[phone] ? [phone] ? FAX [phone] °Web site: www.cent °Surgical Drain [facility] °Surgical drains are used to remove extra fluid that normally builds up in a surgical wound after surgery. A surgical drain helps to heal a surgical wound. Different kinds of surgical drains include: °· Active drains. These drains use suction to pull drainage away from the surgical wound. Drainage flows through a tube to a container outside of the body. With these drains, you need to keep the bulb or the drainage container flat (compressed) at all times, except while you empty it. Flattening the bulb or container creates suction. °· Passive drains. These drains allow fluid to drain naturally, by gravity. Drainage flows through a tube to a bandage (dressing) or a container outside of the body. Passive drains do not need to be emptied. °A drain is placed during surgery. Right after surgery, drainage is usually bright red and a little thicker than water. The drainage may gradually turn yellow or pink and become thinner. It is likely that your health care provider will remove the drain when the drainage stops or when the amount decreases to 1-2 Tbsp (15-30 mL) during a 24-hour period. °Supplies needed: °· Tape. °· Germ-free cleaning solution (sterile saline). °· Cotton swabs. °· Split gauze drain sponge: 4 x 4 inches (10 x 10 cm). °· Gauze square: 4 x 4 inches (10 x 10 cm). °How to care for your surgical drain °Care for your drain as told by your health care provider. This is important to help prevent infection. If your drain is placed at your back, or any other hard-to-reach area, ask another person to assist you in performing the following tasks: °General  care °· Keep the skin around the drain dry and covered with a dressing at all times. °· Check your drain area every day for signs of infection. Check for: °? Redness, swelling, or pain. °? Pus or a bad smell. °? Cloudy drainage. °? Tenderness or pressure at the drain exit site. °Changing the dressing °Follow instructions from your health care provider about how to change your dressing. Change your dressing at least once a day. Change it more often if needed to keep the dressing dry. Make sure you: °1. Gather your supplies. °2. Wash your hands with soap and water before you change your dressing. If soap and water are not available, use hand sanitizer. °3. Remove the old dressing. Avoid using scissors to do that. °4. Wash your hands with soap and water again after removing the old dressing. °5. Use sterile saline to clean your skin around the drain. You may need to use a cotton swab to clean the skin. °6. Place the tube through the slit in a drain sponge. Place the drain sponge so that it covers your wound. °7. Place the gauze square or another drain sponge on top of the drain sponge that is on the wound. Make sure the tube is between those layers. °8. Tape the dressing to your skin. °9. Tape the drainage tube to your   skin 1-2 inches (2.5-5 cm) below the place where the tube enters your body. Taping keeps the tube from pulling on any stitches (sutures) that you have. °10. Wash your hands with soap and water. °11. Write down the color of your drainage and how often you change your dressing. °How to empty your active drain ° °1. Make sure that you have a measuring cup that you can empty your drainage into. °2. Wash your hands with soap and water. If soap and water are not available, use hand sanitizer. °3. Loosen any pins or clips that hold the tube in place. °4. If your health care provider tells you to strip the tube to prevent clots and tube blockages: °? Hold the tube at the skin with one hand. Use your other hand  to pinch the tubing with your thumb and first finger. °? Gently move your fingers down the tube while squeezing very lightly. This clears any drainage, clots, or tissue from the tube. °? You may need to do this several times each day to keep the tube clear. Do not pull on the tube. °5. Open the bulb cap or the drain plug. Do not touch the inside of the cap or the bottom of the plug. °6. Turn the device upside down and gently squeeze. °7. Empty all of the drainage into the measuring cup. °8. Compress the bulb or the container and replace the cap or the plug. To compress the bulb or the container, squeeze it firmly in the middle while you close the cap or plug the container. °9. Write down the amount of drainage that you have in each 24-hour period. If you have less than 2 Tbsp (30 mL) of drainage during 24 hours, contact your health care provider. °10. Flush the drainage down the toilet. °11. Wash your hands with soap and water. °Contact a health care provider if: °· You have redness, swelling, or pain around your drain area. °· You have pus or a bad smell coming from your drain area. °· You have a fever or chills. °· The skin around your drain is warm to the touch. °· The amount of drainage that you have is increasing instead of decreasing. °· You have drainage that is cloudy. °· There is a sudden stop or a sudden decrease in the amount of drainage that you have. °· Your drain tube falls out. °· Your active drain does not stay compressed after you empty it. °Summary °· Surgical drains are used to remove extra fluid that normally builds up in a surgical wound after surgery. °· Different kinds of surgical drains include active drains and passive drains. Active drains use suction to pull drainage away from the surgical wound, and passive drains allow fluid to drain naturally. °· It is important to care for your drain to prevent infection. If your drain is placed at your back, or any other hard-to-reach area, ask  another person to assist you. °· Contact your health care provider if you have redness, swelling, or pain around your drain area. °This information is not intended to replace advice given to you by your health care provider. Make sure you discuss any questions you have with your health care provider. °Document Revised: 09/22/2018 Document Reviewed: 09/22/2018 °Elsevier Patient Education © 2020 [employer] ° °

## 2020-03-08 NOTE — Anesthesia Procedure Notes (Signed)
Anesthesia Regional Block: Pectoralis block   Pre-Anesthetic Checklist: ,, timeout performed, Correct Patient, Correct Site, Correct Laterality, Correct Procedure, Correct Position, site marked, Risks and benefits discussed, pre-op evaluation,  At surgeon's request and post-op pain management  Laterality: Left  Prep: Maximum Sterile Barrier Precautions used, chloraprep       Needles:  Injection technique: Single-shot  Needle Type: Echogenic Stimulator Needle     Needle Length: 9cm  Needle Gauge: 21     Additional Needles:   Procedures:,,,, ultrasound used (permanent image in chart),,,,  Narrative:  Start time: 03/08/2020 8:57 AM End time: 03/08/2020 9:07 AM Injection made incrementally with aspirations every 5 mL. Anesthesiologist: Roderic Palau, MD  Additional Notes: 2% Lidocaine skin wheel.

## 2020-03-08 NOTE — Op Note (Signed)
Preoperative diagnosis: Stage II left breast cancer upper outer quadrant  Postoperative diagnosis: Same  Procedure: Left simple mastectomy with left axillary sentinel lymph node mapping  Surgeon: Erroll Luna, MD  Anesthesia: General with pectoral block per anesthesia  EBL: 20 cc  Specimen: Left breast and to left axillary sentinel nodes to pathology  Drains: TWO 19 round  IV fluids: Per anesthesia record  Indications for procedure: The patient is a 74 year old female with locally advanced left breast cancer.  She was evaluated but felt to not be a good breast conserving candidate due to tumor size of breast size.  We discussed left simple mastectomy with sentinel lymph node mapping is the best option for her given tumor size of breast size.The surgical and non surgical options have been discussed with the patient.  Risks of surgery include bleeding,  Infection,  Flap necrosis,  Tissue loss,  Chronic pain, death, Numbness,  And the need for additional procedures.  Reconstruction options also have been discussed with the patient as well.  The patient agrees to proceed.Sentinel lymph node mapping and dissection has been discussed with the patient.  Risk of bleeding, lymphedema infection,  Seroma formation,  Additional procedures,,  Shoulder weakness ,  Shoulder stiffness,  Nerve and blood vessel injury and reaction to the mapping dyes have been discussed.  Alternatives to surgery have been discussed with the patient.  The patient agrees to proceed.  Description of procedure: The patient was met in the holding area.  Questions were answered left side was marked as correct site and anesthesia placed a pectoral block.  She was then taken back to the operative room.  She is placed supine upon the OR table.  After induction of general esthesia left breast was prepped and draped in a sterile fashion a timeout was performed.  Of note she underwent injection with technetium sulfur colloid in the holding  area.  After verifying site and procedure, superior and superior skin flaps were created with a curvilinear incision made above and below the nipple areolar complex.  Superior skin flap taken the clavicle and the inferior skin flap taken to the inframammary fold.  The dissection was carried medially to the sternum.  And then lateral to the lateral attachments of the breast.  The breast was then dissected off the chest wall in a medial to lateral fashion.  Once lateral attachments were encountered these were divided and the breast was oriented and passed off the field.  Neoprobe was used.  Hospital identified in left axilla level 1.  Dissection carried down and there were 2 hot left axillary sentinel nodes removed.  Background counts approached 0.  The long thoracic nerve, thoracodorsal trunk and axillary vein were all preserved.  Hemostasis achieved with cautery.  Irrigation used.  219 round drains placed through separate stab incisions in the inferior flap and secured with 2-0 nylon.  Wound closed with 3-0 Vicryl and 4-0 Monocryl.  All counts were found to be correct.  Dermabond applied.  Diona Foley was placed to suction.  Breast binder placed.  All counts were correct.  Patient awoke extubated taken to recovery in satisfactory condition.

## 2020-03-08 NOTE — Progress Notes (Signed)
Pt admitted from PACU s/p left simple mastectomy with 2 JPs in tact, Left breast site with dermabond, gauze and ABD pad and pink breast binder.  Pt was oriented to her room and dept.  Pink arm band on left arm.

## 2020-03-08 NOTE — Anesthesia Procedure Notes (Signed)
Procedure Name: LMA Insertion Date/Time: 03/08/2020 9:43 AM Performed by: Griffin Dakin, CRNA Pre-anesthesia Checklist: Patient identified, Emergency Drugs available, Suction available and Patient being monitored Patient Re-evaluated:Patient Re-evaluated prior to induction Oxygen Delivery Method: Circle system utilized Preoxygenation: Pre-oxygenation with 100% oxygen Induction Type: IV induction Ventilation: Mask ventilation without difficulty LMA: LMA inserted LMA Size: 4.0 Number of attempts: 1 Placement Confirmation: positive ETCO2 and breath sounds checked- equal and bilateral Tube secured with: Tape Dental Injury: Teeth and Oropharynx as per pre-operative assessment

## 2020-03-08 NOTE — Plan of Care (Signed)
  Problem: Education: Goal: Required Educational Video(s) Outcome: Progressing   Problem: Clinical Measurements: Goal: Postoperative complications will be avoided or minimized Outcome: Progressing   Problem: Skin Integrity: Goal: Demonstration of wound healing without infection will improve Outcome: Progressing   Problem: Education: Goal: Knowledge of disease or condition will improve Outcome: Progressing   Problem: Activity: Goal: Ability to maintain or regain function will improve Outcome: Progressing   Problem: Clinical Measurements: Goal: Postoperative complications will be avoided or minimized Outcome: Progressing   Problem: Self-Concept: Goal: Ability to verbalize positive feelings about self will improve Outcome: Progressing   Problem: Pain Management: Goal: Expressions of feelings of enhanced comfort will increase Outcome: Progressing   Problem: Skin Integrity: Goal: Demonstration of wound healing without infection will improve Outcome: Progressing

## 2020-03-08 NOTE — Transfer of Care (Signed)
Immediate Anesthesia Transfer of Care Note  Patient: Patty Lopez  Procedure(s) Performed: LEFT BREAST SIMPLE MASTECTOMY WITH LEFT SENTINEL LYMPH NODE MAPPING (Left Breast)  Patient Location: PACU  Anesthesia Type:General  Level of Consciousness: drowsy  Airway & Oxygen Therapy: Patient Spontanous Breathing and Patient connected to face mask oxygen  Post-op Assessment: Report given to RN and Post -op Vital signs reviewed and stable  Post vital signs: Reviewed and stable  Last Vitals:  Vitals Value Taken Time  BP 106/52 03/08/20 1105  Temp    Pulse 60 03/08/20 1108  Resp 15 03/08/20 1108  SpO2 95 % 03/08/20 1108  Vitals shown include unvalidated device data.  Last Pain:  Vitals:   03/08/20 0834  TempSrc:   PainSc: 0-No pain      Patients Stated Pain Goal: 1 (16/10/96 0454)  Complications: No complications documented.

## 2020-03-09 ENCOUNTER — Encounter (HOSPITAL_COMMUNITY): Payer: Self-pay | Admitting: Surgery

## 2020-03-09 DIAGNOSIS — C50412 Malignant neoplasm of upper-outer quadrant of left female breast: Secondary | ICD-10-CM | POA: Diagnosis not present

## 2020-03-09 LAB — CBC
HCT: 33.3 % — ABNORMAL LOW (ref 36.0–46.0)
Hemoglobin: 11 g/dL — ABNORMAL LOW (ref 12.0–15.0)
MCH: 32.1 pg (ref 26.0–34.0)
MCHC: 33 g/dL (ref 30.0–36.0)
MCV: 97.1 fL (ref 80.0–100.0)
Platelets: 186 10*3/uL (ref 150–400)
RBC: 3.43 MIL/uL — ABNORMAL LOW (ref 3.87–5.11)
RDW: 11.7 % (ref 11.5–15.5)
WBC: 5.4 10*3/uL (ref 4.0–10.5)
nRBC: 0 % (ref 0.0–0.2)

## 2020-03-09 LAB — BASIC METABOLIC PANEL
Anion gap: 7 (ref 5–15)
BUN: 11 mg/dL (ref 8–23)
CO2: 24 mmol/L (ref 22–32)
Calcium: 8.7 mg/dL — ABNORMAL LOW (ref 8.9–10.3)
Chloride: 107 mmol/L (ref 98–111)
Creatinine, Ser: 0.64 mg/dL (ref 0.44–1.00)
GFR calc Af Amer: >60 mL/min (ref >60)
GFR calc non Af Amer: >60 mL/min (ref >60)
Glucose, Bld: 324 mg/dL — ABNORMAL HIGH (ref 70–99)
Potassium: 3.5 mmol/L (ref 3.5–5.1)
Sodium: 138 mmol/L (ref 135–145)

## 2020-03-09 LAB — GLUCOSE, CAPILLARY: Glucose-Capillary: 323 mg/dL — ABNORMAL HIGH (ref 70–99)

## 2020-03-09 MED ORDER — ACETAMINOPHEN 325 MG PO TABS: 650.0000 mg | ORAL_TABLET | Freq: Four times a day (QID) | ORAL | Status: DC | PRN

## 2020-03-09 NOTE — Discharge Summary (Signed)
[facility] Discharge Summary   Patient ID: Patty Lopez MRN: [id number] DOB/AGE: 1945-10-05 74 y.o.  Admit date: 03/08/2020 Discharge date: 03/09/2020  Admitting Diagnosis: Stage II left breast cancer upper outer quadrant  Discharge Diagnosis Patient Active Problem List   Diagnosis Date Noted  . Breast cancer, stage 2, left ([place]) 03/08/2020  . Newly diagnosed diabetes ([place]) 02/17/2020  . Malignant neoplasm of upper-outer quadrant of left breast in female, estrogen receptor positive ([place]) 02/06/2020    Consultants None  Imaging: NM Sentinel Node Inj-No Rpt (Breast)  Result Date: 03/08/2020 Sulfur colloid was injected by the nuclear medicine technologist for melanoma sentinel node.    Procedures Dr. Brantley Stage (03/08/2020) - Left simple mastectomy with left axillary sentinel lymph node mapping  Hospital Course:  Patty Lopez is a 74yo female with history of Stage II left breast cancer upper outer quadrant who was admitted to the hospital yesterday for Left simple mastectomy with left axillary sentinel lymph node mapping. Tolerated procedure well and was transferred to the floor.  Diet was advanced as tolerated.  On POD1 the patient was voiding well, tolerating diet, ambulating well, pain well controlled, vital signs stable, incisions c/d/i and felt stable for discharge home.  Patient will follow up as below and knows to call with questions or concerns.     Physical Exam: General:  Alert, NAD, pleasant, comfortable Pulm: rate and effort normal Abd:  Soft, ND, NT Chest: left breast incision cdi without erythema or drainage, JP drain x2 with low volume serosanguinous drainage (25 and 15cc)   Allergies as of 03/09/2020   No Known Allergies     Medication List    TAKE these medications   acetaminophen 325 MG tablet Commonly known as: TYLENOL Take 2 tablets (650 mg total) by mouth every 6 (six) hours as needed for mild pain (or Fever >/= 101).   CALCIUM + D3  PO Take 1 tablet by mouth every evening.   clonazePAM 1 MG tablet Commonly known as: KLONOPIN Take 0.5-1 mg by mouth daily as needed for anxiety.   EQ Suphedrine PE 5-325 MG Tabs Generic drug: Phenylephrine-Acetaminophen Take by mouth.   fluticasone 50 MCG/ACT nasal spray Commonly known as: FLONASE Place 2 sprays into both nostrils daily.   FreeStyle Libre 14 Day Reader Kerrin Mo See admin instructions.   HYDROcodone-acetaminophen 5-325 MG tablet Commonly known as: NORCO/VICODIN Take 1 tablet by mouth every 6 (six) hours as needed for moderate pain.   ibuprofen 800 MG tablet Commonly known as: ADVIL Take 1 tablet (800 mg total) by mouth every 8 (eight) hours as needed.   insulin aspart 100 UNIT/ML injection Commonly known as: novoLOG Inject 1 Units into the skin 3 (three) times daily before meals.   Lantus SoloStar 100 UNIT/ML Solostar Pen Generic drug: insulin glargine Inject 6 Units into the skin at bedtime. Takes 6 units   levothyroxine 112 MCG tablet Commonly known as: SYNTHROID Take 112 mcg by mouth daily before breakfast.   metFORMIN 1000 MG tablet Commonly known as: GLUCOPHAGE Take 1,000 mg by mouth in the morning and at bedtime.   multivitamin with minerals Tabs tablet Take 1 tablet by mouth every evening.   RA Suphedrine PE 4-10 MG tablet Generic drug: Chlorpheniramine-Phenylephrine Take 1 tablet by mouth in the morning and at bedtime.   sertraline 100 MG tablet Commonly known as: ZOLOFT Take 200 mg by mouth daily.         Follow-up Information    Erroll Luna, MD. Schedule  an appointment as soon as possible for a visit in 1 week.   Specialty: General Surgery Why: For wound re-check Contact information: [place] [address] [phone]               Signed: Wellington Hampshire, [facility] 03/09/2020, 10:46 AM Please see Amion for pager number during day hours 7:00am-4:30pm

## 2020-03-09 NOTE — Plan of Care (Signed)
  Problem: Education: Goal: Required Educational Video(s) Outcome: Adequate for Discharge   Problem: Clinical Measurements: Goal: Postoperative complications will be avoided or minimized Outcome: Adequate for Discharge   Problem: Skin Integrity: Goal: Demonstration of wound healing without infection will improve Outcome: Adequate for Discharge   Problem: Education: Goal: Knowledge of disease or condition will improve Outcome: Adequate for Discharge   Problem: Activity: Goal: Ability to maintain or regain function will improve Outcome: Adequate for Discharge   Problem: Clinical Measurements: Goal: Postoperative complications will be avoided or minimized Outcome: Adequate for Discharge   Problem: Self-Concept: Goal: Ability to verbalize positive feelings about self will improve Outcome: Adequate for Discharge   Problem: Pain Management: Goal: Expressions of feelings of enhanced comfort will increase Outcome: Adequate for Discharge   Problem: Skin Integrity: Goal: Demonstration of wound healing without infection will improve Outcome: Adequate for Discharge

## 2020-03-14 ENCOUNTER — Telehealth: Payer: Self-pay | Admitting: *Deleted

## 2020-03-14 ENCOUNTER — Encounter: Payer: Self-pay | Admitting: *Deleted

## 2020-03-14 DIAGNOSIS — C50912 Malignant neoplasm of unspecified site of left female breast: Secondary | ICD-10-CM | POA: Diagnosis not present

## 2020-03-14 DIAGNOSIS — E1165 Type 2 diabetes mellitus with hyperglycemia: Secondary | ICD-10-CM | POA: Diagnosis not present

## 2020-03-14 DIAGNOSIS — F411 Generalized anxiety disorder: Secondary | ICD-10-CM | POA: Diagnosis not present

## 2020-03-14 DIAGNOSIS — Z09 Encounter for follow-up examination after completed treatment for conditions other than malignant neoplasm: Secondary | ICD-10-CM | POA: Diagnosis not present

## 2020-03-14 NOTE — Telephone Encounter (Signed)
Ordered oncotype per Dr. Gudena. Faxed requisition to pathology. °

## 2020-03-20 DIAGNOSIS — C50412 Malignant neoplasm of upper-outer quadrant of left female breast: Secondary | ICD-10-CM | POA: Diagnosis not present

## 2020-03-20 DIAGNOSIS — Z170 Estrogen receptor positive status [ER+]: Secondary | ICD-10-CM | POA: Diagnosis not present

## 2020-03-21 ENCOUNTER — Encounter: Payer: Self-pay | Admitting: *Deleted

## 2020-03-21 ENCOUNTER — Telehealth: Payer: Self-pay | Admitting: *Deleted

## 2020-03-21 DIAGNOSIS — E1065 Type 1 diabetes mellitus with hyperglycemia: Secondary | ICD-10-CM | POA: Diagnosis not present

## 2020-03-21 LAB — SURGICAL PATHOLOGY

## 2020-03-21 NOTE — Telephone Encounter (Signed)
Received oncotype results of 17/5%.  Appt with Dr. Lindi Adie is 7/26.

## 2020-03-25 NOTE — Progress Notes (Signed)
Patient Care Team: London Pepper, MD as PCP - General (Family Medicine) Mauro Kaufmann, RN as Oncology Nurse Navigator Rockwell Germany, RN as Oncology Nurse Navigator Nicholas Lose, MD as Consulting Physician (Hematology and Oncology)  DIAGNOSIS:    ICD-10-CM   1. Malignant neoplasm of upper-outer quadrant of left breast in female, estrogen receptor positive ([place])  C50.412    Z17.0     SUMMARY OF ONCOLOGIC HISTORY: Oncology History  Malignant neoplasm of upper-outer quadrant of left breast in female, estrogen receptor positive ([place])  01/26/2020 Initial Diagnosis   Left breast biopsy: LCIS, CSL, fibroadenoma.  Foci mildly suspicious for early stromal invasion.  Pleomorphic features   02/06/2020 Cancer Staging   Staging form: Breast, AJCC 8th Edition - Clinical stage from 02/06/2020: Stage IB (cT2, cN0, cM0, G2, ER+, PR+, HER2-) - Signed by Nicholas Lose, MD on 02/06/2020   03/08/2020 Cancer Staging   Staging form: Breast, AJCC 8th Edition - Pathologic stage from 03/08/2020: Stage IA (pT2, pN0, cM0, G2, ER+, PR+, HER2-) - Signed by Gardenia Phlegm, NP on 03/21/2020   03/08/2020 Surgery   Left mastectomy ([facility]): invasive and in situ lobular carcinoma, grade 2, 3.5cm, and multifocal invasive ductal carcinoma, grade 1, 0.6cm, clear margins, 3 left axillary lymph nodes negative for carcinoma.    03/21/2020 Oncotype testing   Oncotype testing showed a score of 17/5%     CHIEF COMPLIANT: Follow-up of left breast cancer s/p left mastectomy   INTERVAL HISTORY: Patty Lopez is a 74 y.o. with above-mentioned history of left breast cancer. She underwent a left mastectomy on 03/08/20 with Dr. Brantley Stage for which pathology confirmed invasive and in situ lobular carcinoma, grade 2, 3.5cm, and multifocal invasive ductal carcinoma, grade 1, 0.6cm, clear margins, 3 left axillary lymph nodes negative for carcinoma. Oncotype testing showed a score of 17/5%. She presents to the clinic today to review  the pathology report and further treatment.  She is healing very well from recent surgery.  She does not have any intention for reconstruction.  ALLERGIES:  has No Known Allergies.  MEDICATIONS:  Current Outpatient Medications  Medication Sig Dispense Refill  . acetaminophen (TYLENOL) 325 MG tablet Take 2 tablets (650 mg total) by mouth every 6 (six) hours as needed for mild pain (or Fever >/= 101).    . Calcium Carb-Cholecalciferol (CALCIUM + D3 PO) Take 1 tablet by mouth every evening.    . Chlorpheniramine-Phenylephrine (RA SUPHEDRINE PE) 4-10 MG tablet Take 1 tablet by mouth in the morning and at bedtime.    . clonazePAM (KLONOPIN) 1 MG tablet Take 0.5-1 mg by mouth daily as needed for anxiety.    . Continuous Blood Gluc Receiver (FREESTYLE LIBRE 14 DAY READER) DEVI See admin instructions.    . fluticasone (FLONASE) 50 MCG/ACT nasal spray Place 2 sprays into both nostrils daily.    Marland Kitchen HYDROcodone-acetaminophen (NORCO/VICODIN) 5-325 MG tablet Take 1 tablet by mouth every 6 (six) hours as needed for moderate pain. 15 tablet 0  . ibuprofen (ADVIL) 800 MG tablet Take 1 tablet (800 mg total) by mouth every 8 (eight) hours as needed. 30 tablet 0  . insulin aspart (NOVOLOG) 100 UNIT/ML injection Inject 1 Units into the skin 3 (three) times daily before meals.    Marland Kitchen LANTUS SOLOSTAR 100 UNIT/ML Solostar Pen Inject 6 Units into the skin at bedtime. Takes 6 units    . levothyroxine (SYNTHROID) 112 MCG tablet Take 112 mcg by mouth daily before breakfast.    .  metFORMIN (GLUCOPHAGE) 1000 MG tablet Take 1,000 mg by mouth in the morning and at bedtime.    . Multiple Vitamin (MULTIVITAMIN WITH MINERALS) TABS tablet Take 1 tablet by mouth every evening.    Marland Kitchen Phenylephrine-Acetaminophen (EQ SUPHEDRINE PE) 5-325 MG TABS Take by mouth.    . sertraline (ZOLOFT) 100 MG tablet Take 200 mg by mouth daily.     No current facility-administered medications for this visit.    PHYSICAL EXAMINATION: ECOG PERFORMANCE  STATUS: 1 - Symptomatic but completely ambulatory  There were no vitals filed for this visit. There were no vitals filed for this visit.  LABORATORY DATA:  I have reviewed the data as listed CMP Latest Ref Rng & Units 03/09/2020 02/29/2020  Glucose 70 - 99 mg/dL 324(H) 198(H)  BUN 8 - 23 mg/dL 11 20  Creatinine 0.44 - 1.00 mg/dL 0.64 0.75  Sodium 135 - 145 mmol/L 138 136  Potassium 3.5 - 5.1 mmol/L 3.5 4.3  Chloride 98 - 111 mmol/L 107 102  CO2 22 - 32 mmol/L 24 25  Calcium 8.9 - 10.3 mg/dL 8.7(L) 9.8  Total Protein 6.5 - 8.1 g/dL - 7.2  Total Bilirubin 0.3 - 1.2 mg/dL - 0.6  Alkaline Phos 38 - 126 U/L - 48  AST 15 - 41 U/L - 17  ALT 0 - 44 U/L - 14    Lab Results  Component Value Date   WBC 5.4 03/09/2020   HGB 11.0 (L) 03/09/2020   HCT 33.3 (L) 03/09/2020   MCV 97.1 03/09/2020   PLT 186 03/09/2020   NEUTROABS 2.6 02/29/2020    ASSESSMENT & PLAN:  Malignant neoplasm of upper-outer quadrant of left breast in female, estrogen receptor positive ([place]) Mammogram detected left breast mass with a history of right breast cancer in 2002 treated with lumpectomy chemotherapy and radiation.  The left breast mass measured 2.6 cm.  Biopsy revealed grade 2 invasive lobular carcinoma ER/PR positive HER-2 negative with a Ki-67 of 15%.  Additional biopsies revealed LCIS and CSL.  T2N0 stage Ib  Recommendations: 1.  03/08/2020:Left mastectomy ([facility]): invasive and in situ lobular carcinoma, grade 2, 3.5cm, and multifocal invasive ductal carcinoma, grade 1, 0.6cm, clear margins, 3 left axillary lymph nodes negative for carcinoma. 2. Oncotype DX testing: Score of 17, risk of distant recurrence: 5% (no role of chemotherapy 3. Adjuvant antiestrogen therapy ----------------------------------------------------------------------------------------------------------------------------------------------- Treatment plan: Adjuvant antiestrogen therapy with anastrozole 1 mg daily x5-7 years  Anastrozole  counseling:We discussed the risks and benefits of anti-estrogen therapy with aromatase inhibitors. These include but not limited to insomnia, hot flashes, mood changes, vaginal dryness, bone density loss, and weight gain. We strongly believe that the benefits far outweigh the risks. Patient understands these risks and consented to starting treatment. Planned treatment duration is 5-7 years.  Patient has lots of anxieties related to her husband who apparently collectors her home and is hoarder.  She currently drinks 1 glass of wine every day and does not have any intention of cutting down. Her blood sugars are being managed by endocrinology.  She decided to stop Metformin by herself.  I instructed her to call the endocrinologist and inform them of this.  Return to clinic in 3 months for survivorship care plan visit.   No orders of the defined types were placed in this encounter.  The patient has a good understanding of the overall plan. she agrees with it. she will call with any problems that may develop before the next visit here.  Total time spent:  30 mins including face to face time and time spent for planning, charting and coordination of care  Nicholas Lose, MD 03/26/2020  I, Cloyde Reams Dorshimer, am acting as scribe for Dr. Nicholas Lose.  I have reviewed the above documentation for accuracy and completeness, and I agree with the above.

## 2020-03-26 ENCOUNTER — Telehealth: Payer: Self-pay | Admitting: Hematology and Oncology

## 2020-03-26 ENCOUNTER — Inpatient Hospital Stay: Payer: PPO | Attending: Hematology and Oncology | Admitting: Hematology and Oncology

## 2020-03-26 ENCOUNTER — Encounter: Payer: Self-pay | Admitting: *Deleted

## 2020-03-26 ENCOUNTER — Other Ambulatory Visit: Payer: Self-pay

## 2020-03-26 DIAGNOSIS — Z923 Personal history of irradiation: Secondary | ICD-10-CM | POA: Diagnosis not present

## 2020-03-26 DIAGNOSIS — Z9221 Personal history of antineoplastic chemotherapy: Secondary | ICD-10-CM | POA: Insufficient documentation

## 2020-03-26 DIAGNOSIS — Z79899 Other long term (current) drug therapy: Secondary | ICD-10-CM | POA: Diagnosis not present

## 2020-03-26 DIAGNOSIS — Z170 Estrogen receptor positive status [ER+]: Secondary | ICD-10-CM | POA: Diagnosis not present

## 2020-03-26 DIAGNOSIS — Z853 Personal history of malignant neoplasm of breast: Secondary | ICD-10-CM | POA: Insufficient documentation

## 2020-03-26 DIAGNOSIS — C50412 Malignant neoplasm of upper-outer quadrant of left female breast: Secondary | ICD-10-CM

## 2020-03-26 DIAGNOSIS — Z9012 Acquired absence of left breast and nipple: Secondary | ICD-10-CM | POA: Diagnosis not present

## 2020-03-26 MED ORDER — ANASTROZOLE 1 MG PO TABS
1.0000 mg | ORAL_TABLET | Freq: Every day | ORAL | 3 refills | Status: DC
Start: 2020-03-26 — End: 2021-03-21

## 2020-03-26 NOTE — Assessment & Plan Note (Signed)
Mammogram detected left breast mass with a history of right breast cancer in 2002 treated with lumpectomy chemotherapy and radiation.  The left breast mass measured 2.6 cm.  Biopsy revealed grade 2 invasive lobular carcinoma ER/PR positive HER-2 negative with a Ki-67 of 15%.  Additional biopsies revealed LCIS and CSL.  T2N0 stage Ib  Recommendations: 1.  03/08/2020:Left mastectomy ([facility]): invasive and in situ lobular carcinoma, grade 2, 3.5cm, and multifocal invasive ductal carcinoma, grade 1, 0.6cm, clear margins, 3 left axillary lymph nodes negative for carcinoma. 2. Oncotype DX testing: Score of 17, risk of distant recurrence: 5% (no role of chemotherapy 3. Adjuvant antiestrogen therapy ----------------------------------------------------------------------------------------------------------------------------------------------- Treatment plan: Adjuvant antiestrogen therapy with anastrozole 1 mg daily x7 years  Anastrozole counseling:We discussed the risks and benefits of anti-estrogen therapy with aromatase inhibitors. These include but not limited to insomnia, hot flashes, mood changes, vaginal dryness, bone density loss, and weight gain. We strongly believe that the benefits far outweigh the risks. Patient understands these risks and consented to starting treatment. Planned treatment duration is 7 years.  Counseled her regarding upbeat clinical trial and antiestrogen therapy compliance studies. Return to clinic in 3 months for survivorship care plan visit.

## 2020-03-26 NOTE — Telephone Encounter (Signed)
Scheduled appts per 7/26 los. Pt declined print out of AVS.

## 2020-04-06 ENCOUNTER — Encounter: Payer: Self-pay | Admitting: Hematology and Oncology

## 2020-04-16 DIAGNOSIS — C50912 Malignant neoplasm of unspecified site of left female breast: Secondary | ICD-10-CM | POA: Diagnosis not present

## 2020-04-16 DIAGNOSIS — C50911 Malignant neoplasm of unspecified site of right female breast: Secondary | ICD-10-CM | POA: Diagnosis not present

## 2020-04-16 DIAGNOSIS — Z853 Personal history of malignant neoplasm of breast: Secondary | ICD-10-CM | POA: Diagnosis not present

## 2020-04-25 DIAGNOSIS — C50912 Malignant neoplasm of unspecified site of left female breast: Secondary | ICD-10-CM | POA: Diagnosis not present

## 2020-04-25 DIAGNOSIS — Z853 Personal history of malignant neoplasm of breast: Secondary | ICD-10-CM | POA: Diagnosis not present

## 2020-04-25 DIAGNOSIS — C50911 Malignant neoplasm of unspecified site of right female breast: Secondary | ICD-10-CM | POA: Diagnosis not present

## 2020-04-27 DIAGNOSIS — E1065 Type 1 diabetes mellitus with hyperglycemia: Secondary | ICD-10-CM | POA: Diagnosis not present

## 2020-05-29 DIAGNOSIS — E109 Type 1 diabetes mellitus without complications: Secondary | ICD-10-CM | POA: Diagnosis not present

## 2020-05-29 DIAGNOSIS — Z794 Long term (current) use of insulin: Secondary | ICD-10-CM | POA: Diagnosis not present

## 2020-06-01 DIAGNOSIS — E1065 Type 1 diabetes mellitus with hyperglycemia: Secondary | ICD-10-CM | POA: Diagnosis not present

## 2020-06-01 DIAGNOSIS — Z794 Long term (current) use of insulin: Secondary | ICD-10-CM | POA: Diagnosis not present

## 2020-06-12 DIAGNOSIS — E1065 Type 1 diabetes mellitus with hyperglycemia: Secondary | ICD-10-CM | POA: Diagnosis not present

## 2020-06-12 DIAGNOSIS — Z794 Long term (current) use of insulin: Secondary | ICD-10-CM | POA: Diagnosis not present

## 2020-06-18 DIAGNOSIS — R195 Other fecal abnormalities: Secondary | ICD-10-CM | POA: Diagnosis not present

## 2020-06-18 DIAGNOSIS — E1065 Type 1 diabetes mellitus with hyperglycemia: Secondary | ICD-10-CM | POA: Diagnosis not present

## 2020-06-18 DIAGNOSIS — K429 Umbilical hernia without obstruction or gangrene: Secondary | ICD-10-CM | POA: Diagnosis not present

## 2020-06-19 DIAGNOSIS — Z794 Long term (current) use of insulin: Secondary | ICD-10-CM | POA: Diagnosis not present

## 2020-06-19 DIAGNOSIS — Z961 Presence of intraocular lens: Secondary | ICD-10-CM | POA: Diagnosis not present

## 2020-06-19 DIAGNOSIS — H524 Presbyopia: Secondary | ICD-10-CM | POA: Diagnosis not present

## 2020-06-19 DIAGNOSIS — E119 Type 2 diabetes mellitus without complications: Secondary | ICD-10-CM | POA: Diagnosis not present

## 2020-06-20 ENCOUNTER — Other Ambulatory Visit: Payer: Self-pay | Admitting: *Deleted

## 2020-06-20 NOTE — Patient Outreach (Signed)
  [facility]) Care Management Chronic Special Needs Program    06/20/2020  Name: Patty Lopez, DOB: 08/01/1946  MRN: [id number]  in Ms. [name] is enrolled in a chronic special needs plan for Diabetes.  Outreach call to client for initial telephone outreach, spoke with client who reports " I'm outside working, call another dayCustomer service manager client in 1- 2 weeks  Jacqlyn Larsen [facility], BSN [place], [place]

## 2020-06-26 DIAGNOSIS — F411 Generalized anxiety disorder: Secondary | ICD-10-CM | POA: Diagnosis not present

## 2020-06-26 DIAGNOSIS — E1165 Type 2 diabetes mellitus with hyperglycemia: Secondary | ICD-10-CM | POA: Diagnosis not present

## 2020-06-26 DIAGNOSIS — E039 Hypothyroidism, unspecified: Secondary | ICD-10-CM | POA: Diagnosis not present

## 2020-06-26 DIAGNOSIS — C50919 Malignant neoplasm of unspecified site of unspecified female breast: Secondary | ICD-10-CM | POA: Diagnosis not present

## 2020-06-26 DIAGNOSIS — Z794 Long term (current) use of insulin: Secondary | ICD-10-CM | POA: Diagnosis not present

## 2020-06-27 ENCOUNTER — Encounter: Payer: PPO | Admitting: Adult Health

## 2020-07-02 DIAGNOSIS — E1065 Type 1 diabetes mellitus with hyperglycemia: Secondary | ICD-10-CM | POA: Diagnosis not present

## 2020-07-03 ENCOUNTER — Other Ambulatory Visit: Payer: Self-pay | Admitting: *Deleted

## 2020-07-03 NOTE — Patient Outreach (Signed)
  [facility]) Care Management Chronic Special Needs Program    07/03/2020  Name: Patty Lopez, DOB: 14-Jan-1946  MRN: [id number]   [name] is enrolled in a chronic special needs plan for Diabetes.  Outreach call to client for initial telephone outreach/  2nd attempt, spoke with client who reports this is not a good day to talk as she is having colonoscopy tomorrow and will be "going through the cleaning out process and I have a lot of my mind today".  PLAN Outreach client in 1-2 weeks  Jacqlyn Larsen [facility], BSN [place], [place]

## 2020-07-04 DIAGNOSIS — R195 Other fecal abnormalities: Secondary | ICD-10-CM | POA: Diagnosis not present

## 2020-07-04 DIAGNOSIS — D124 Benign neoplasm of descending colon: Secondary | ICD-10-CM | POA: Diagnosis not present

## 2020-07-04 DIAGNOSIS — D120 Benign neoplasm of cecum: Secondary | ICD-10-CM | POA: Diagnosis not present

## 2020-07-04 DIAGNOSIS — D122 Benign neoplasm of ascending colon: Secondary | ICD-10-CM | POA: Diagnosis not present

## 2020-07-04 DIAGNOSIS — K635 Polyp of colon: Secondary | ICD-10-CM | POA: Diagnosis not present

## 2020-07-04 DIAGNOSIS — Z1211 Encounter for screening for malignant neoplasm of colon: Secondary | ICD-10-CM | POA: Diagnosis not present

## 2020-07-10 ENCOUNTER — Other Ambulatory Visit: Payer: Self-pay | Admitting: *Deleted

## 2020-07-10 NOTE — Patient Outreach (Signed)
[facility]) Care Management Chronic Special Needs Program    07/10/2020  Name: Patty Lopez, DOB: 23-Oct-1945  MRN: [id number]   Patty Lopez is enrolled in a chronic special needs plan for Diabetes.  Client called into [facility] office and states " I don't feel like this is something I need and would like to cancel my appointment"  RN care manager created individualized care plan from information in health risk assessment and electronic medical record.  Goals    . Client understands the importance of follow-up with providers by attending scheduled visits     Continue to follow up with primary care provider and specialists    . HEMOGLOBIN A1C < 7     Your last documented AIC is 8.5 on 02/29/20.  Have your [facility] checked every 6 months if you are at goal or every 3 months if you are not at goal. Check blood sugars daily before eating with goal of 80-130.  You can also check 1 1/2 hours after eating with goal of 180 or less. Plan to eat low carbohydrate and low salt meals, watch portion sizes and avoid sugar sweetened drinks.  Discussed carbohydrate control meals. Reviewed signs and symptoms of hyperglycemia (high blood sugar) and hypoglycemia (low blood sugar) and actions to take. Review [employer] calendar (sent in the mail) for diabetes action plan in the back. Increase activity only if you are able to do it.  Follow doctor recommendations. EMMI education provided on "Diabetes and Diet"  "Diabetic Meal Planning".  Review and plan to discuss with RN during next telephonic assessment.  Use 24 hour nurse advice line at [phone] as needed      . Maintain timely refills of diabetic medication as prescribed within the year .     Contact your RN care manager if you have questions about medicines. Medication review completed from EMR information. It is important to take your medications as prescribed.      . Obtain annual  Lipid Profile, LDL-C     Per medical  record review, unable to determine when Lipid profile completed  The goal for LDL is less than 70mg /dl as you are at high risk for complications. Try to avoid saturated fats, trans-fats and eat more fiber. Plan to take statin (cholesterol) medicine as ordered.     . Obtain Annual Eye (retinal)  Exam      Unable to determine last documented eye exam  Diabetes can affect your vision.  Plan to have a dilated eye exam every year. Advised client to keep and/ or schedule appointment with eye doctor. Continue to use your eye drops as prescribed.      . Obtain Annual Foot Exam     Your doctor should check your bare feet at each visit. Diabetes can affect the nerves in your feet, causing decreased feeling or numbness. Check your feet and in-between toes daily for cuts, bruises, redness, blisters or sores.  If you cannot reach them, use a mirror. Wash feet with soap and water, dry feet well especially between toes.  Don't use too much lotion. Wear shoes that are not too tight and don't walk barefoot.      . Obtain annual screen for micro albuminuria (urine) , nephropathy (kidney problems)     Diabetes can affect your kidneys. It is important for your doctor to check your urine at least once a year  These tests show how your kidneys are working.     Marland Kitchen  Obtain Hemoglobin A1C at least 2 times per year     Plan to have Hgb AIC checked twice yearly    . Visit Primary Care Provider or Endocrinologist at least 2 times per year      Client saw primary care provider 03/14/20 and 06/26/20          PLAN RN care manager faxed today's note with individualized care plan to primary care provider, mailed individualized care plan to client along with unsuccessful outreach letter, education materials, 24 hour nurse line magnet.  Outreach client in 9-12 months (per Tier 1)  Jacqlyn Larsen [facility], BSN [place], [place]

## 2020-07-11 ENCOUNTER — Other Ambulatory Visit: Payer: Self-pay | Admitting: *Deleted

## 2020-07-11 NOTE — Patient Outreach (Signed)
°  [facility]) Care Management Chronic Special Needs Program    07/11/2020  Name: Patty Lopez, DOB: 1945-12-20  MRN: [id number]   Patty Lopez is enrolled in a chronic special needs plan for Diabetes.  [facility] care management will continue to provide services for this member through 08/31/20.  The [employer] care management team will assume care 09/01/20.   Jacqlyn Larsen [facility], BSN [place], [place]

## 2020-07-23 DIAGNOSIS — E1065 Type 1 diabetes mellitus with hyperglycemia: Secondary | ICD-10-CM | POA: Diagnosis not present

## 2020-07-23 DIAGNOSIS — Z794 Long term (current) use of insulin: Secondary | ICD-10-CM | POA: Diagnosis not present

## 2020-09-05 ENCOUNTER — Other Ambulatory Visit: Payer: Self-pay | Admitting: *Deleted

## 2020-10-23 DIAGNOSIS — Z794 Long term (current) use of insulin: Secondary | ICD-10-CM | POA: Diagnosis not present

## 2020-10-23 DIAGNOSIS — E1065 Type 1 diabetes mellitus with hyperglycemia: Secondary | ICD-10-CM | POA: Diagnosis not present

## 2020-12-20 ENCOUNTER — Other Ambulatory Visit: Payer: Self-pay | Admitting: Family Medicine

## 2020-12-20 DIAGNOSIS — Z1231 Encounter for screening mammogram for malignant neoplasm of breast: Secondary | ICD-10-CM

## 2020-12-25 DIAGNOSIS — L309 Dermatitis, unspecified: Secondary | ICD-10-CM | POA: Diagnosis not present

## 2020-12-25 DIAGNOSIS — E1165 Type 2 diabetes mellitus with hyperglycemia: Secondary | ICD-10-CM | POA: Diagnosis not present

## 2020-12-25 DIAGNOSIS — Z0000 Encounter for general adult medical examination without abnormal findings: Secondary | ICD-10-CM | POA: Diagnosis not present

## 2020-12-25 DIAGNOSIS — F411 Generalized anxiety disorder: Secondary | ICD-10-CM | POA: Diagnosis not present

## 2020-12-25 DIAGNOSIS — E039 Hypothyroidism, unspecified: Secondary | ICD-10-CM | POA: Diagnosis not present

## 2020-12-25 DIAGNOSIS — C50919 Malignant neoplasm of unspecified site of unspecified female breast: Secondary | ICD-10-CM | POA: Diagnosis not present

## 2020-12-25 DIAGNOSIS — M25532 Pain in left wrist: Secondary | ICD-10-CM | POA: Diagnosis not present

## 2021-02-05 DIAGNOSIS — B351 Tinea unguium: Secondary | ICD-10-CM | POA: Diagnosis not present

## 2021-02-07 ENCOUNTER — Other Ambulatory Visit: Payer: Self-pay

## 2021-02-07 ENCOUNTER — Ambulatory Visit
Admission: RE | Admit: 2021-02-07 | Discharge: 2021-02-07 | Disposition: A | Discharge status: Home / Self Care | Payer: HMO | Source: Ambulatory Visit | Attending: Family Medicine | Admitting: Family Medicine

## 2021-02-07 DIAGNOSIS — Z1231 Encounter for screening mammogram for malignant neoplasm of breast: Secondary | ICD-10-CM | POA: Diagnosis not present

## 2021-02-11 DIAGNOSIS — E039 Hypothyroidism, unspecified: Secondary | ICD-10-CM | POA: Diagnosis not present

## 2021-02-12 DIAGNOSIS — Z794 Long term (current) use of insulin: Secondary | ICD-10-CM | POA: Diagnosis not present

## 2021-02-12 DIAGNOSIS — E1065 Type 1 diabetes mellitus with hyperglycemia: Secondary | ICD-10-CM | POA: Diagnosis not present

## 2021-03-20 ENCOUNTER — Other Ambulatory Visit: Payer: Self-pay | Admitting: Hematology and Oncology

## 2021-04-04 ENCOUNTER — Other Ambulatory Visit: Payer: Self-pay | Admitting: Hematology and Oncology

## 2021-04-10 ENCOUNTER — Other Ambulatory Visit: Payer: Self-pay | Admitting: Adult Health

## 2021-04-10 DIAGNOSIS — C50912 Malignant neoplasm of unspecified site of left female breast: Secondary | ICD-10-CM

## 2021-04-12 DIAGNOSIS — M21611 Bunion of right foot: Secondary | ICD-10-CM | POA: Diagnosis not present

## 2021-04-12 DIAGNOSIS — L600 Ingrowing nail: Secondary | ICD-10-CM | POA: Diagnosis not present

## 2021-04-12 DIAGNOSIS — E039 Hypothyroidism, unspecified: Secondary | ICD-10-CM | POA: Diagnosis not present

## 2021-04-17 ENCOUNTER — Encounter: Payer: Self-pay | Admitting: Adult Health

## 2021-04-17 ENCOUNTER — Inpatient Hospital Stay: Payer: HMO | Attending: Adult Health | Admitting: Adult Health

## 2021-04-17 ENCOUNTER — Other Ambulatory Visit: Payer: Self-pay

## 2021-04-17 VITALS — BP 133/61 | HR 73 | Temp 97.9°F | Resp 16 | Ht 64.0 in | Wt 115.2 lb

## 2021-04-17 DIAGNOSIS — E039 Hypothyroidism, unspecified: Secondary | ICD-10-CM | POA: Diagnosis not present

## 2021-04-17 DIAGNOSIS — Z9012 Acquired absence of left breast and nipple: Secondary | ICD-10-CM | POA: Diagnosis not present

## 2021-04-17 DIAGNOSIS — C50412 Malignant neoplasm of upper-outer quadrant of left female breast: Secondary | ICD-10-CM | POA: Diagnosis not present

## 2021-04-17 DIAGNOSIS — Z9221 Personal history of antineoplastic chemotherapy: Secondary | ICD-10-CM | POA: Diagnosis not present

## 2021-04-17 DIAGNOSIS — E1136 Type 2 diabetes mellitus with diabetic cataract: Secondary | ICD-10-CM | POA: Diagnosis not present

## 2021-04-17 DIAGNOSIS — Z170 Estrogen receptor positive status [ER+]: Secondary | ICD-10-CM

## 2021-04-17 DIAGNOSIS — Z923 Personal history of irradiation: Secondary | ICD-10-CM | POA: Diagnosis not present

## 2021-04-17 DIAGNOSIS — Z79811 Long term (current) use of aromatase inhibitors: Secondary | ICD-10-CM | POA: Insufficient documentation

## 2021-04-17 MED ORDER — ANASTROZOLE 1 MG PO TABS: 1.0000 mg | ORAL_TABLET | Freq: Every day | ORAL | 3 refills | Status: DC

## 2021-04-17 NOTE — Progress Notes (Signed)
[place] [facility] Follow up:    Patty Pepper, MD [address] [place] [address]   DIAGNOSIS: Cancer Staging Malignant neoplasm of upper-outer quadrant of left breast in female, estrogen receptor positive (Early) Staging form: Breast, AJCC 8th Edition - Clinical stage from 02/06/2020: Stage IB (cT2, cN0, cM0, G2, ER+, PR+, HER2-) - Signed by Nicholas Lose, MD on 02/06/2020 Stage prefix: Initial diagnosis Histologic grading system: 3 grade system - Pathologic stage from 03/08/2020: Stage IA (pT2, pN0, cM0, G2, ER+, PR+, HER2-) - Signed by Gardenia Phlegm, NP on 03/21/2020 Stage prefix: Initial diagnosis Histologic grading system: 3 grade system   SUMMARY OF ONCOLOGIC HISTORY: Oncology History  Malignant neoplasm of upper-outer quadrant of left breast in female, estrogen receptor positive ([place])  01/26/2020 Initial Diagnosis   Left breast biopsy: LCIS, CSL, fibroadenoma.  Foci mildly suspicious for early stromal invasion.  Pleomorphic features   02/06/2020 Cancer Staging   Staging form: Breast, AJCC 8th Edition - Clinical stage from 02/06/2020: Stage IB (cT2, cN0, cM0, G2, ER+, PR+, HER2-) - Signed by Nicholas Lose, MD on 02/06/2020   03/08/2020 Cancer Staging   Staging form: Breast, AJCC 8th Edition - Pathologic stage from 03/08/2020: Stage IA (pT2, pN0, cM0, G2, ER+, PR+, HER2-) - Signed by Gardenia Phlegm, NP on 03/21/2020   03/08/2020 Surgery   Left mastectomy ([facility]): invasive and in situ lobular carcinoma, grade 2, 3.5cm, and multifocal invasive ductal carcinoma, grade 1, 0.6cm, clear margins, 3 left axillary lymph nodes negative for carcinoma.    03/21/2020 Oncotype testing   Oncotype testing showed a score of 17/5%     CURRENT THERAPY: Anastrozole daily  INTERVAL HISTORY: Patty Lopez 74 y.o. female returns for evaluation of her h/o breast cancer.  She notes that she is doing moderately well.  She was diagnosed with diabetes  recently and notes she has had difficulty with managing her blood sugars.  This is a stressor for her.  She is taking the Anastrozole daily and tolerates this moderately well.     Patient Active Problem List   Diagnosis Date Noted   Breast cancer, stage 2, left ([place]) 03/08/2020   Newly diagnosed diabetes ([place]) 02/17/2020   Malignant neoplasm of upper-outer quadrant of left breast in female, estrogen receptor positive ([place]) 02/06/2020    has No Known Allergies.  MEDICAL HISTORY: Past Medical History:  Diagnosis Date   Anxiety    Cancer ([place])    right breast cancer 2002, left breast cancer 12/2019   Depression    Diabetes mellitus without complication ([place]) 7342   Type I   Hypothyroidism    Personal history of chemotherapy    Personal history of radiation therapy    PONV (postoperative nausea and vomiting)     SURGICAL HISTORY: Past Surgical History:  Procedure Laterality Date   ABDOMINAL HYSTERECTOMY     Partial   AUGMENTATION MAMMAPLASTY Right    BREAST LUMPECTOMY Right 11/23/2000   EYE SURGERY  2021   cataract   MASTECTOMY W/ SENTINEL NODE BIOPSY Left 03/08/2020   Procedure: LEFT BREAST SIMPLE MASTECTOMY WITH LEFT SENTINEL LYMPH NODE MAPPING;  Surgeon: Erroll Luna, MD;  Location: [facility];  Service: General;  Laterality: Left;  PEC BLOCK   REDUCTION MAMMAPLASTY Left     SOCIAL HISTORY: Social History   Socioeconomic History   Marital status: Married    Spouse name: Not on file   Number of children: Not on file   Years of  education: Not on file   Highest education level: Not on file  Occupational History   Not on file  Tobacco Use   Smoking status: Former   Smokeless tobacco: Former  Scientific laboratory technician Use: Never used  Substance and Sexual Activity   Alcohol use: Yes    Alcohol/week: 7.0 standard drinks    Types: 7 Glasses of wine per week   Drug use: Never   Sexual activity: Not Currently  Other Topics Concern   Not on file  Social History Narrative    Not on file   Social Determinants of Health   Financial Resource Strain: Not on file  Food Insecurity: Not on file  Transportation Needs: Not on file  Physical Activity: Not on file  Stress: Not on file  Social Connections: Not on file  Intimate Partner Violence: Not on file    FAMILY HISTORY: Non contributory  Review of Systems  Constitutional:  Positive for fatigue. Negative for appetite change, chills, fever and unexpected weight change.  HENT:   Negative for hearing loss, lump/mass and trouble swallowing.   Eyes:  Negative for eye problems and icterus.  Respiratory:  Negative for chest tightness, cough and shortness of breath.   Cardiovascular:  Negative for chest pain, leg swelling and palpitations.  Gastrointestinal:  Negative for abdominal distention, abdominal pain, constipation, diarrhea, nausea and vomiting.  Endocrine: Negative for hot flashes.  Genitourinary:  Negative for difficulty urinating.   Musculoskeletal:  Negative for arthralgias.  Skin:  Negative for itching and rash.  Neurological:  Negative for dizziness, extremity weakness, headaches and numbness.  Hematological:  Negative for adenopathy. Does not bruise/bleed easily.  Psychiatric/Behavioral:  Negative for depression. The patient is nervous/anxious.      PHYSICAL EXAMINATION  ECOG PERFORMANCE STATUS: 1 - Symptomatic but completely ambulatory  Vitals:   04/17/21 1137  BP: 133/61  Pulse: 73  Resp: 16  Temp: 97.9 F (36.6 C)  SpO2: 100%    Physical Exam Constitutional:      General: She is not in acute distress.    Appearance: Normal appearance. She is not toxic-appearing.  HENT:     Head: Normocephalic and atraumatic.  Eyes:     General: No scleral icterus. Cardiovascular:     Rate and Rhythm: Normal rate and regular rhythm.     Pulses: Normal pulses.     Heart sounds: Normal heart sounds.  Pulmonary:     Effort: Pulmonary effort is normal.     Breath sounds: Normal breath sounds.      Comments: Left breast s/p mastectomy no sign of local recurrence, right breast is benign Abdominal:     General: Abdomen is flat. Bowel sounds are normal. There is no distension.     Palpations: Abdomen is soft.     Tenderness: There is no abdominal tenderness.  Musculoskeletal:        General: No swelling.     Cervical back: Neck supple.  Lymphadenopathy:     Cervical: No cervical adenopathy.  Skin:    General: Skin is warm and dry.     Findings: No rash.  Neurological:     General: No focal deficit present.     Mental Status: She is alert.  Psychiatric:        Mood and Affect: Mood normal.        Behavior: Behavior normal.    LABORATORY DATA:  CBC    Component Value Date/Time   WBC 5.4 03/09/2020 0848  RBC 3.43 (L) 03/09/2020 0848   HGB 11.0 (L) 03/09/2020 0848   HCT 33.3 (L) 03/09/2020 0848   PLT 186 03/09/2020 0848   MCV 97.1 03/09/2020 0848   MCH 32.1 03/09/2020 0848   MCHC 33.0 03/09/2020 0848   RDW 11.7 03/09/2020 0848   LYMPHSABS 2.0 02/29/2020 1519   MONOABS 0.5 02/29/2020 1519   EOSABS 0.1 02/29/2020 1519   BASOSABS 0.0 02/29/2020 1519    CMP     Component Value Date/Time   NA 138 03/09/2020 0848   K 3.5 03/09/2020 0848   CL 107 03/09/2020 0848   CO2 24 03/09/2020 0848   GLUCOSE 324 (H) 03/09/2020 0848   BUN 11 03/09/2020 0848   CREATININE 0.64 03/09/2020 0848   CALCIUM 8.7 (L) 03/09/2020 0848   PROT 7.2 02/29/2020 1519   ALBUMIN 4.0 02/29/2020 1519   AST 17 02/29/2020 1519   ALT 14 02/29/2020 1519   ALKPHOS 48 02/29/2020 1519   BILITOT 0.6 02/29/2020 1519   GFRNONAA >60 03/09/2020 0848   GFRAA >60 03/09/2020 0848    ASSESSMENT and PLAN:   Malignant neoplasm of upper-outer quadrant of left breast in female, estrogen receptor positive ([place]) Mammogram detected left breast mass with a history of right breast cancer in 2002 treated with lumpectomy chemotherapy and radiation.  The left breast mass measured 2.6 cm.  Biopsy revealed grade 2  invasive lobular carcinoma ER/PR positive HER-2 negative with a Ki-67 of 15%.  Additional biopsies revealed LCIS and CSL.   T2N0 stage Ib  Recommendations: 1.  03/08/2020:Left mastectomy ([facility]): invasive and in situ lobular carcinoma, grade 2, 3.5cm, and multifocal invasive ductal carcinoma, grade 1, 0.6cm, clear margins, 3 left axillary lymph nodes negative for carcinoma. 2. Oncotype DX testing: Score of 17, risk of distant recurrence: 5% (no role of chemotherapy 3. Adjuvant antiestrogen therapy ----------------------------------------------------------------------------------------------------------------------------------------------- Treatment plan: Adjuvant antiestrogen therapy with anastrozole 1 mg daily x7 years  Anastrozole: Tolerating well.  She has no sign of cancer recurrence Breast exam: 04/17/2021 no sign of local recurrence Right breast mammogram: no sign of malignancy breast density C on 02/07/2021  Health maintenance and activity recommendations were reviewed.  She will return in 1 year for f/u with Dr. Lindi Adie.    All questions were answered. The patient knows to call the clinic with any problems, questions or concerns. We can certainly see the patient much sooner if necessary.  Total encounter time: 30 minutes in face to face visit time, chart review, order entry, care coordination, and documentation of encounter.    Wilber Bihari, NP 04/19/21 7:09 AM Medical Oncology and Hematology [facility] [place], [place] [address] Tel. [phone]    Fax. [phone]

## 2021-04-19 NOTE — Assessment & Plan Note (Signed)
Mammogram detected left breast mass with a history of right breast cancer in 2002 treated with lumpectomy chemotherapy and radiation.  The left breast mass measured 2.6 cm.  Biopsy revealed grade 2 invasive lobular carcinoma ER/PR positive HER-2 negative with a Ki-67 of 15%.  Additional biopsies revealed LCIS and CSL.  T2N0 stage Ib  Recommendations: 1.  03/08/2020:Left mastectomy ([facility]): invasive and in situ lobular carcinoma, grade 2, 3.5cm, and multifocal invasive ductal carcinoma, grade 1, 0.6cm, clear margins, 3 left axillary lymph nodes negative for carcinoma. 2. Oncotype DX testing: Score of 17, risk of distant recurrence: 5% (no role of chemotherapy 3. Adjuvant antiestrogen therapy ----------------------------------------------------------------------------------------------------------------------------------------------- Treatment plan: Adjuvant antiestrogen therapy with anastrozole 1 mg daily x7 years  Anastrozole: Tolerating well.  She has no sign of cancer recurrence Breast exam: 04/17/2021 no sign of local recurrence Right breast mammogram: no sign of malignancy breast density C on 02/07/2021  Health maintenance and activity recommendations were reviewed.  She will return in 1 year for f/u with Dr. Lindi Adie.

## 2021-04-24 ENCOUNTER — Ambulatory Visit: Payer: HMO | Admitting: Hematology and Oncology

## 2021-05-17 DIAGNOSIS — E1065 Type 1 diabetes mellitus with hyperglycemia: Secondary | ICD-10-CM | POA: Diagnosis not present

## 2021-05-17 DIAGNOSIS — Z7984 Long term (current) use of oral hypoglycemic drugs: Secondary | ICD-10-CM | POA: Diagnosis not present

## 2021-05-17 DIAGNOSIS — Z794 Long term (current) use of insulin: Secondary | ICD-10-CM | POA: Diagnosis not present

## 2021-05-21 DIAGNOSIS — E039 Hypothyroidism, unspecified: Secondary | ICD-10-CM | POA: Diagnosis not present

## 2021-06-21 DIAGNOSIS — M7989 Other specified soft tissue disorders: Secondary | ICD-10-CM | POA: Diagnosis not present

## 2021-06-21 DIAGNOSIS — E039 Hypothyroidism, unspecified: Secondary | ICD-10-CM | POA: Diagnosis not present

## 2021-06-21 DIAGNOSIS — E1165 Type 2 diabetes mellitus with hyperglycemia: Secondary | ICD-10-CM | POA: Diagnosis not present

## 2021-06-21 DIAGNOSIS — F411 Generalized anxiety disorder: Secondary | ICD-10-CM | POA: Diagnosis not present

## 2021-06-25 DIAGNOSIS — E119 Type 2 diabetes mellitus without complications: Secondary | ICD-10-CM | POA: Diagnosis not present

## 2021-07-01 DIAGNOSIS — E039 Hypothyroidism, unspecified: Secondary | ICD-10-CM | POA: Diagnosis not present

## 2021-07-30 DIAGNOSIS — E1165 Type 2 diabetes mellitus with hyperglycemia: Secondary | ICD-10-CM | POA: Diagnosis not present

## 2021-09-11 ENCOUNTER — Encounter: Payer: HMO | Admitting: Adult Health

## 2021-10-18 DIAGNOSIS — E109 Type 1 diabetes mellitus without complications: Secondary | ICD-10-CM | POA: Diagnosis not present

## 2021-10-30 DIAGNOSIS — L281 Prurigo nodularis: Secondary | ICD-10-CM | POA: Diagnosis not present

## 2021-10-30 DIAGNOSIS — B070 Plantar wart: Secondary | ICD-10-CM | POA: Diagnosis not present

## 2021-12-26 ENCOUNTER — Other Ambulatory Visit: Payer: Self-pay | Admitting: Family Medicine

## 2021-12-26 DIAGNOSIS — E039 Hypothyroidism, unspecified: Secondary | ICD-10-CM | POA: Diagnosis not present

## 2021-12-26 DIAGNOSIS — F411 Generalized anxiety disorder: Secondary | ICD-10-CM | POA: Diagnosis not present

## 2021-12-26 DIAGNOSIS — E1165 Type 2 diabetes mellitus with hyperglycemia: Secondary | ICD-10-CM | POA: Diagnosis not present

## 2021-12-26 DIAGNOSIS — C50919 Malignant neoplasm of unspecified site of unspecified female breast: Secondary | ICD-10-CM | POA: Diagnosis not present

## 2021-12-26 DIAGNOSIS — Z1231 Encounter for screening mammogram for malignant neoplasm of breast: Secondary | ICD-10-CM

## 2021-12-26 DIAGNOSIS — Z0000 Encounter for general adult medical examination without abnormal findings: Secondary | ICD-10-CM | POA: Diagnosis not present

## 2022-01-17 DIAGNOSIS — E1065 Type 1 diabetes mellitus with hyperglycemia: Secondary | ICD-10-CM | POA: Diagnosis not present

## 2022-01-17 DIAGNOSIS — E109 Type 1 diabetes mellitus without complications: Secondary | ICD-10-CM | POA: Diagnosis not present

## 2022-02-10 ENCOUNTER — Ambulatory Visit: Payer: HMO

## 2022-02-10 ENCOUNTER — Ambulatory Visit
Admission: RE | Admit: 2022-02-10 | Discharge: 2022-02-10 | Disposition: A | Discharge status: Home / Self Care | Payer: HMO | Source: Ambulatory Visit | Attending: Family Medicine | Admitting: Family Medicine

## 2022-02-10 DIAGNOSIS — Z1231 Encounter for screening mammogram for malignant neoplasm of breast: Secondary | ICD-10-CM

## 2022-02-20 DIAGNOSIS — E039 Hypothyroidism, unspecified: Secondary | ICD-10-CM | POA: Diagnosis not present

## 2022-03-08 IMAGING — MG MM BREAST LOCALIZATION CLIP
4 series · 4 of 12 positions shown · non-contrast
Comparison: Previous exam(s).

CLINICAL DATA: Two stereotactic biopsies were performed of the left
breast today. Patient was recently diagnosed with left breast
cancer, upper outer quadrant. The first biopsy performed today was
of an area of architectural distortion seen in the central left
breast, posteriorly on the cc view. The second biopsy was to
evaluate a group of calcifications in the upper central left breast.

EXAM:
DIAGNOSTIC LEFT MAMMOGRAM POST STEREOTACTIC BIOPSIES

[L CC synth-2D]
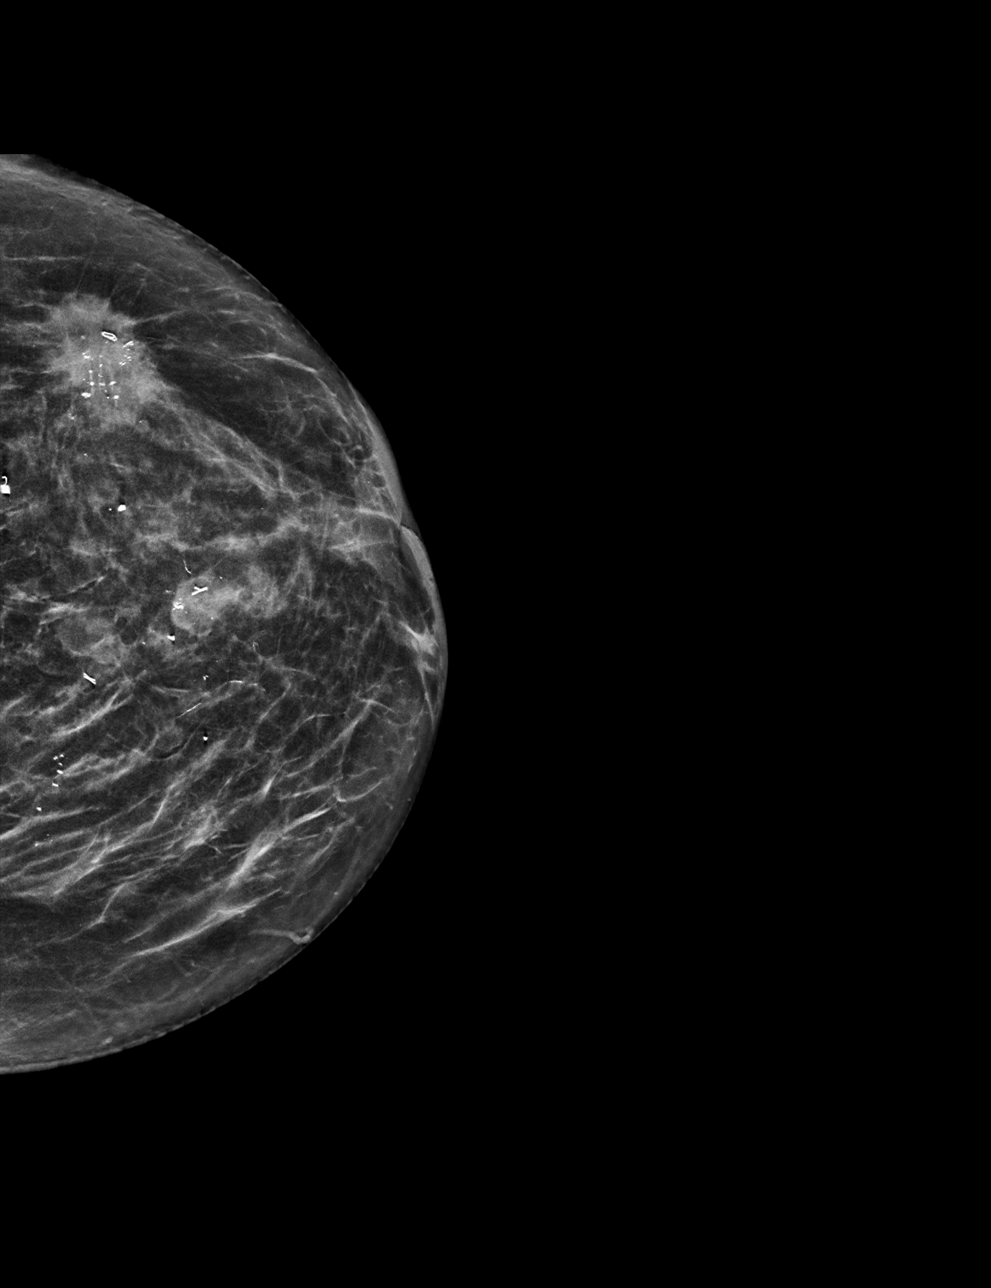

[L ML synth-2D]
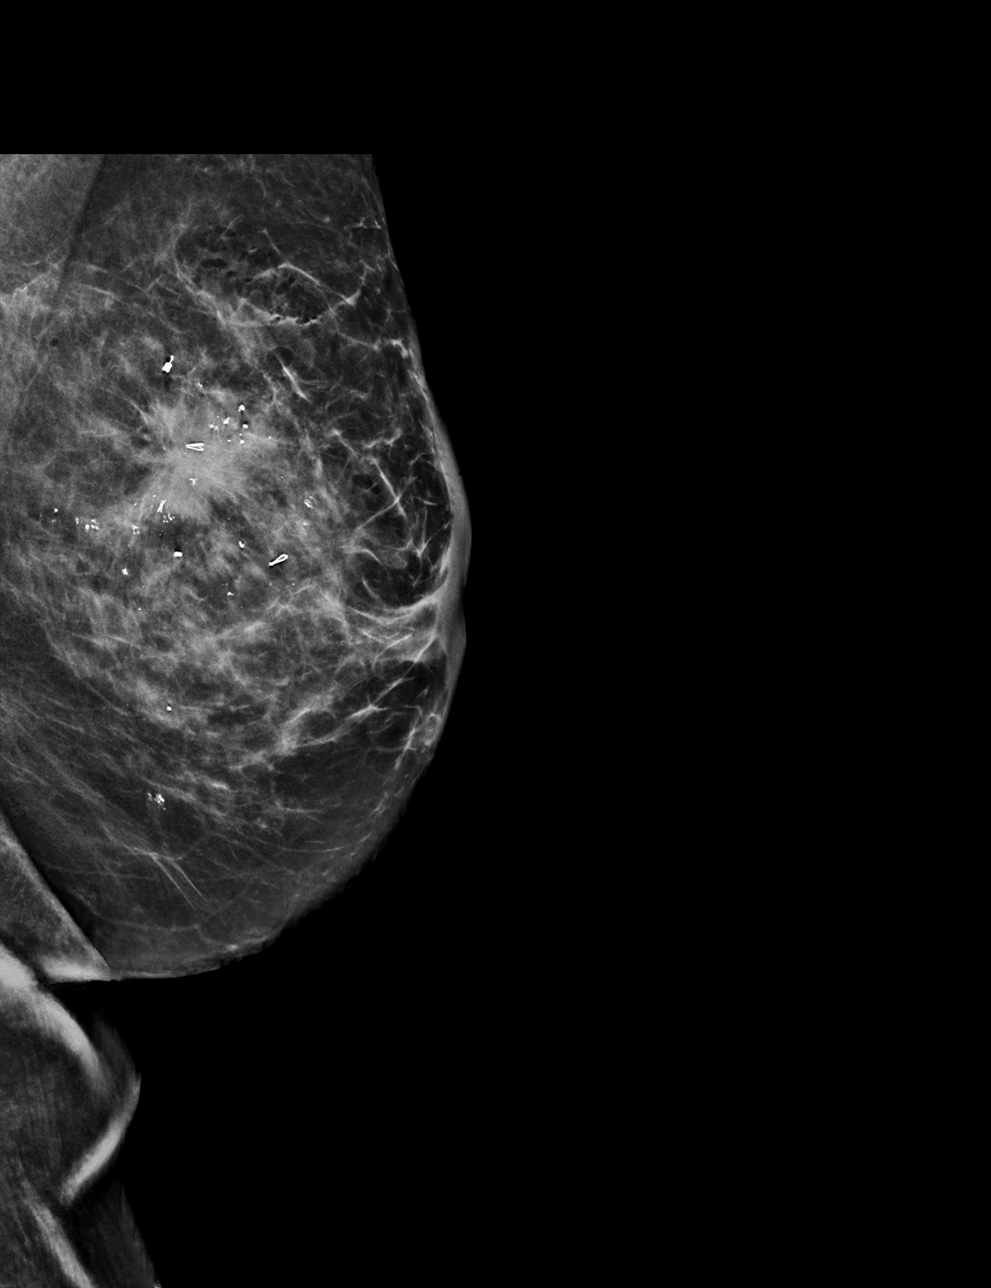

[L ML tomo · tomo slice 30/59.0]
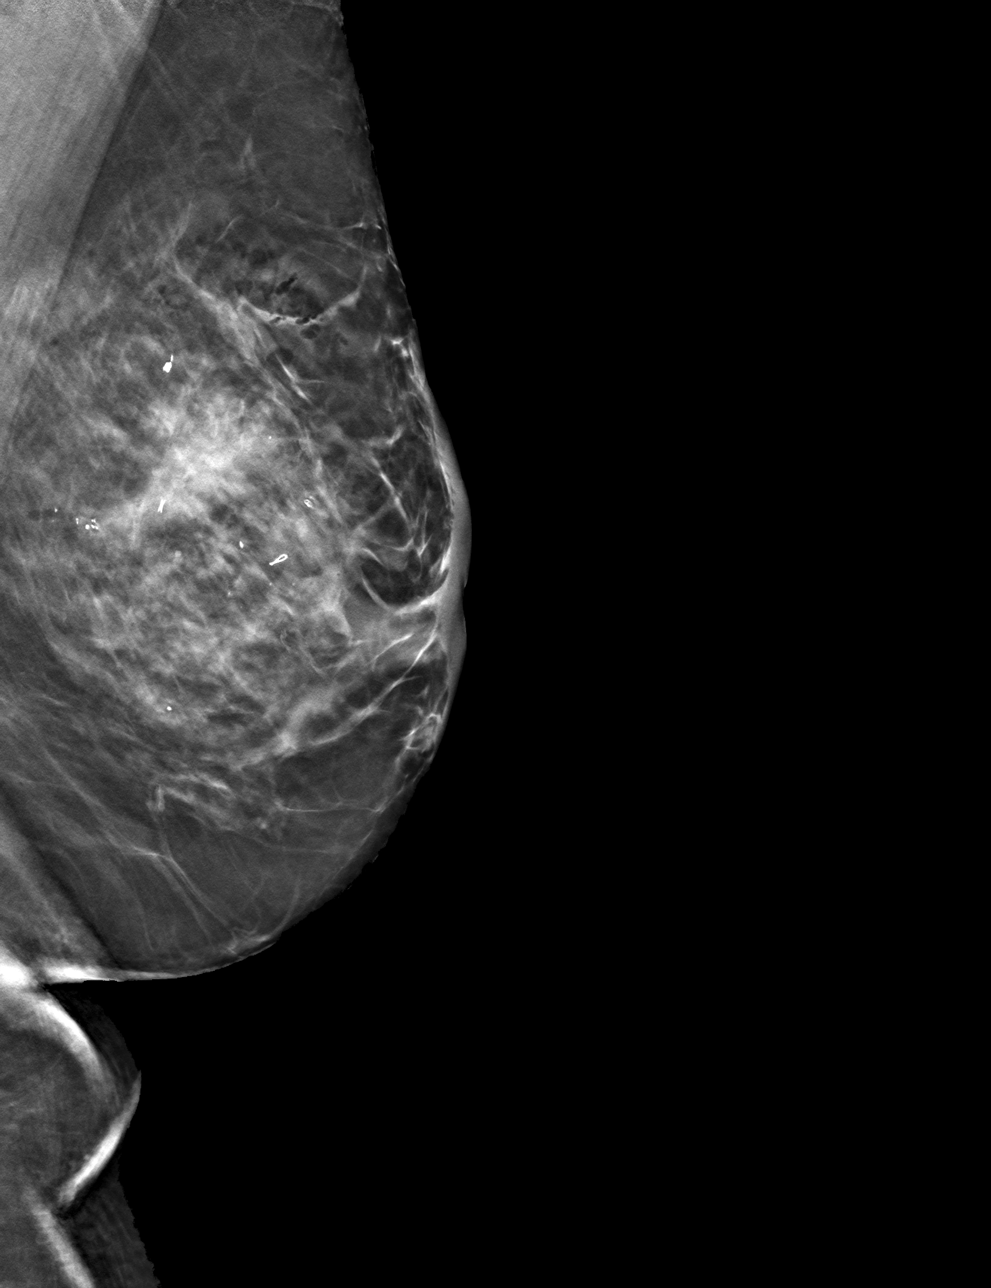

[L CC tomo · tomo slice 28/55.0]
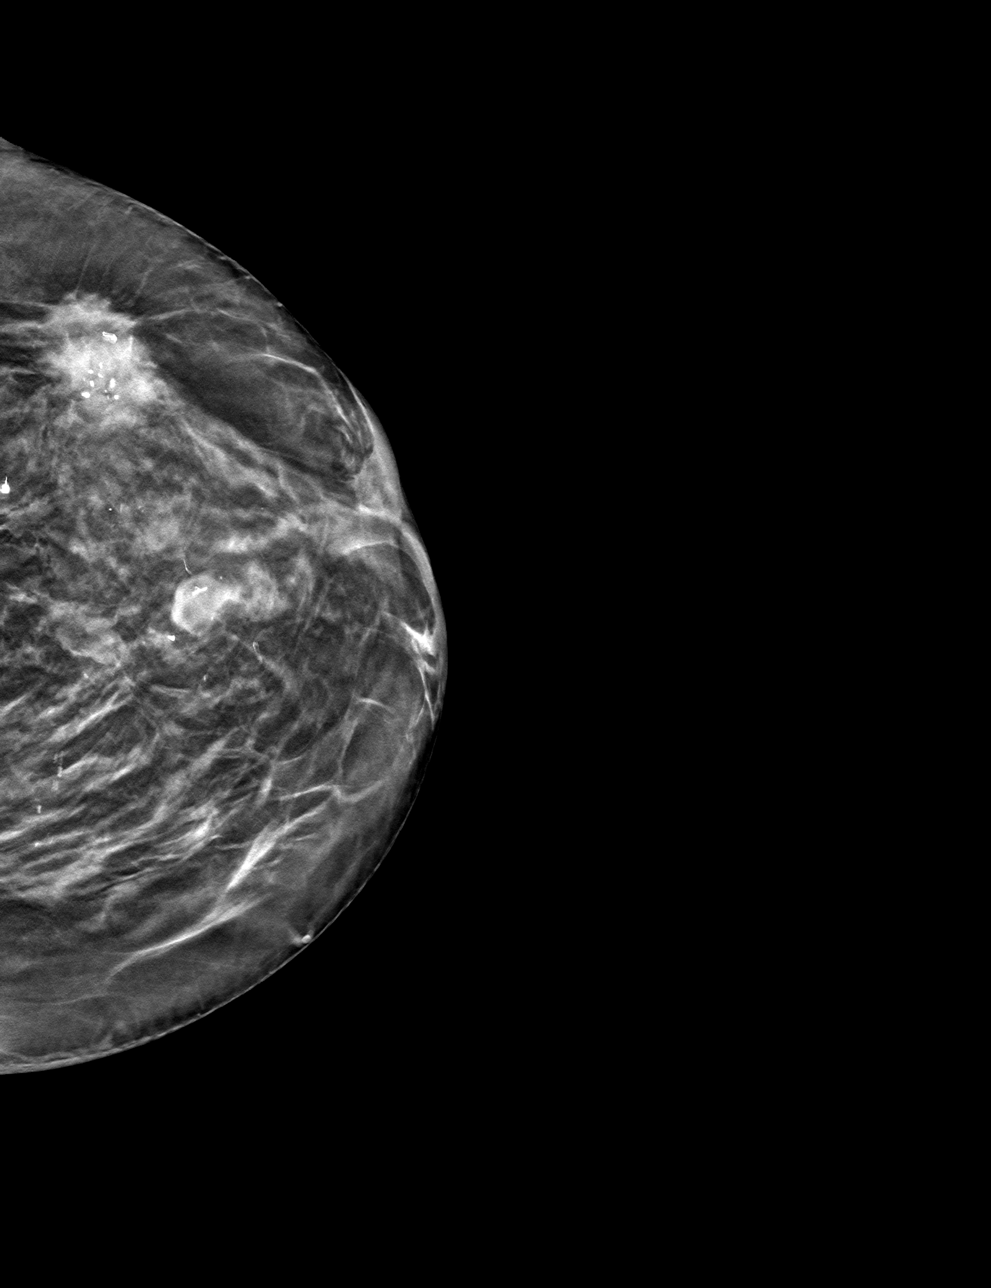

[4 of 12 positions shown; findings below may reference images not displayed]

FINDINGS: Mammographic images were obtained following stereotactic guided
biopsy of distortion in the central left breast. The X shaped biopsy
marking clip is in the upper central left breast, in satisfactory
position.

Ribbon shaped biopsy clip is satisfactorily positioned at the site
of biopsied calcifications in the anterior half of the upper central
left breast, with ribbon shaped biopsy clip placed.
IMPRESSION: Appropriate positioning of the X and ribbon shaped biopsy marking
clips as described above.

Final Assessment: Post Procedure Mammograms for Marker Placement

## 2022-03-08 IMAGING — MG MM BREAST BX W LOC DEV EA AD LESION IMG BX SPEC STEREO GUIDE*L*
8 of 10 series · 8 of 22 positions shown · non-contrast
Comparison: Previous exams.
COMPARISON: Previous exams.

Addendum:
CLINICAL DATA: Stereotactic biopsy is being performed coarse
calcifications in the upper central left breast that are separate
from the recently diagnosed left breast cancer and separate from
recently diagnosed benign calcifications in the medial left breast.

EXAM:
LEFT BREAST STEREOTACTIC CORE NEEDLE BIOPSY

[L (1 of 6)]
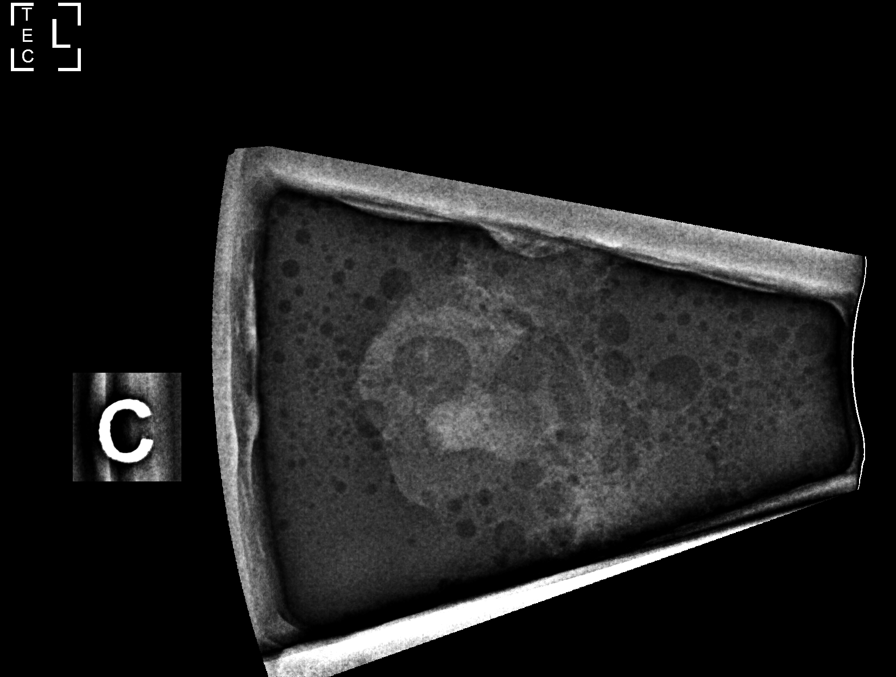

[L (2 of 6)]
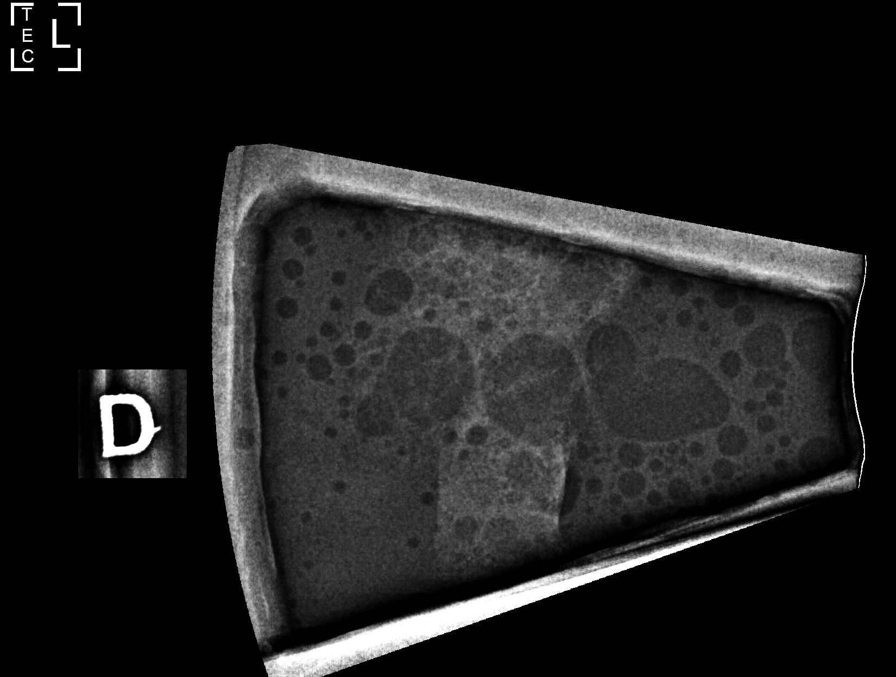

[L (3 of 6)]
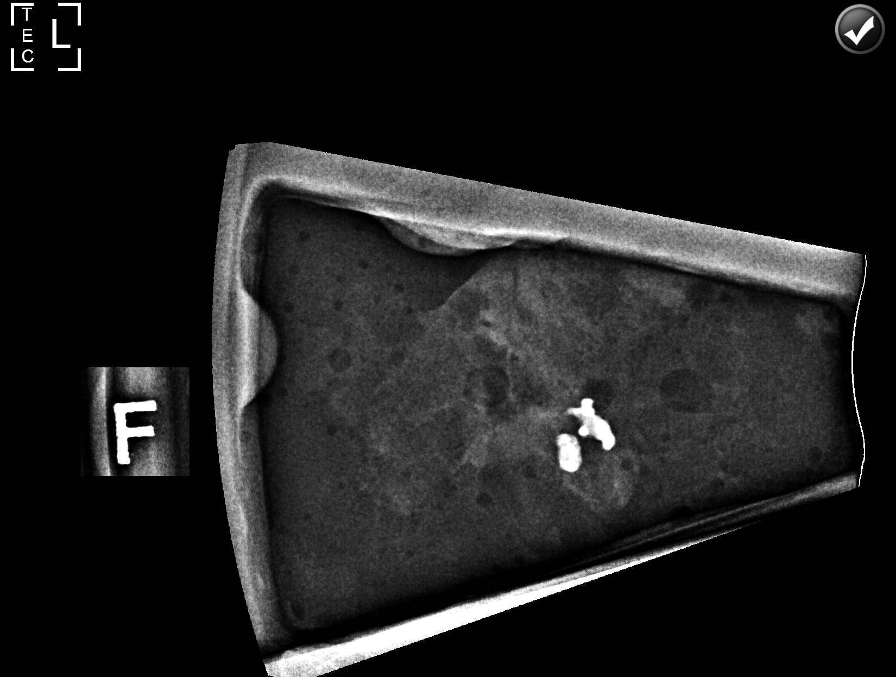

[L (4 of 6)]
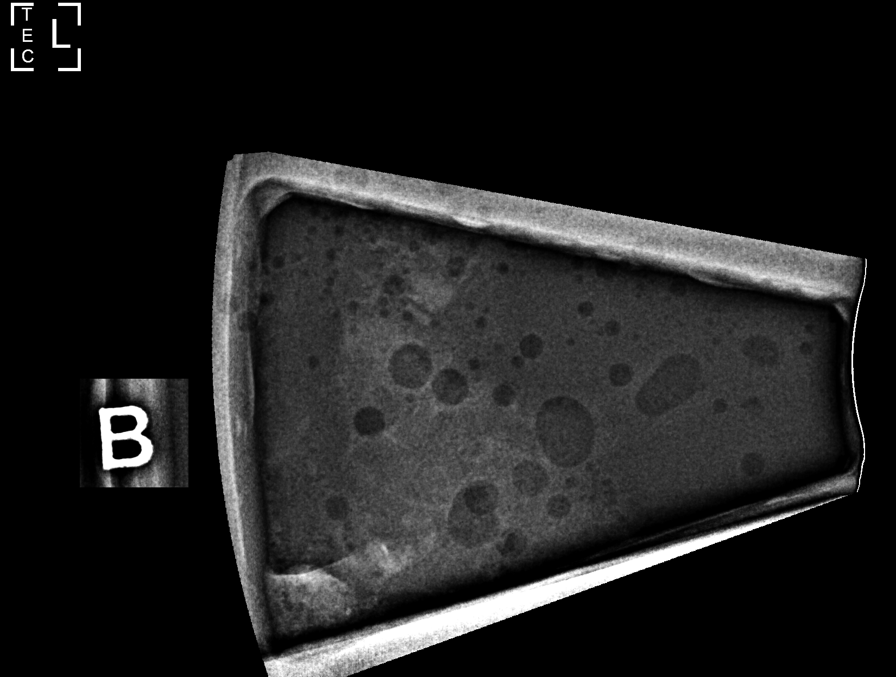

[L (5 of 6)]
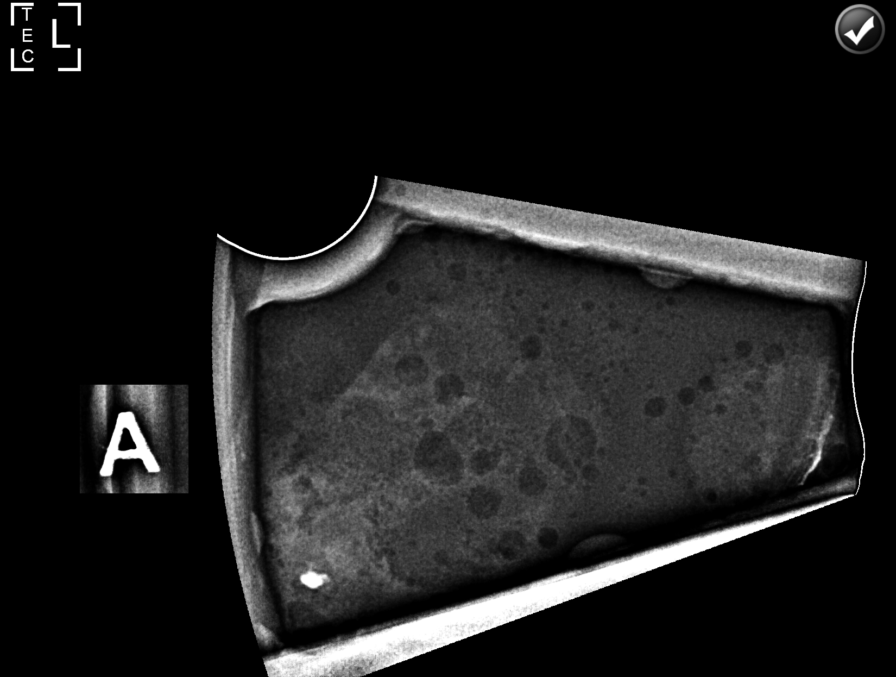

[L (6 of 6)]
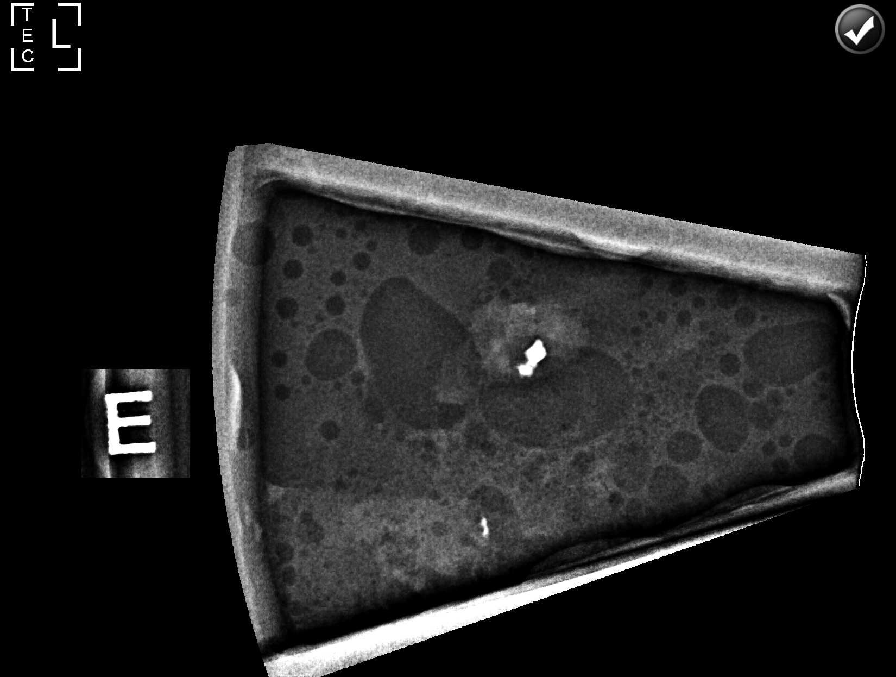

[L CC]
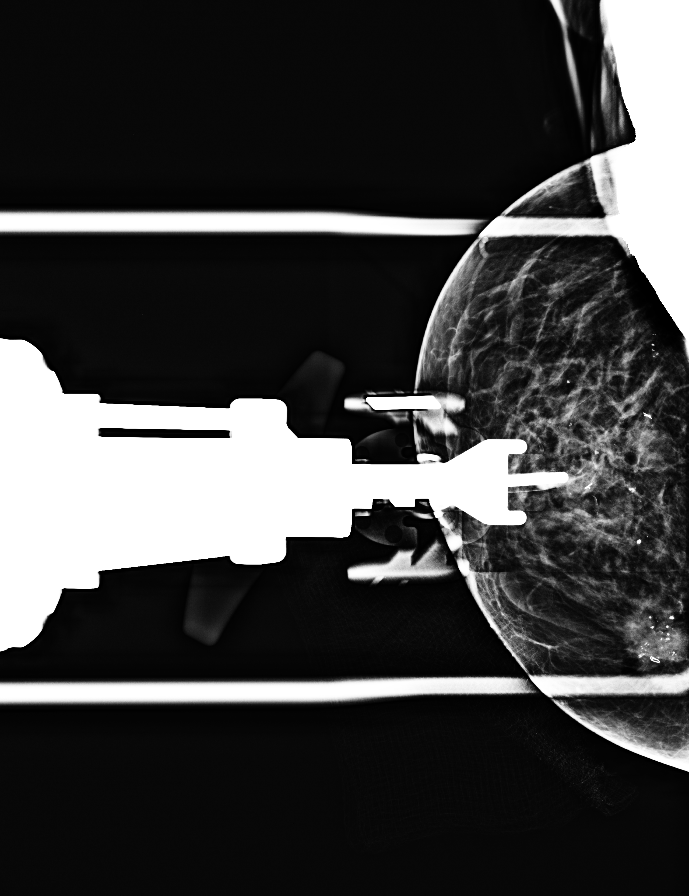

[L CC tomo · tomo slice 27/52.0]
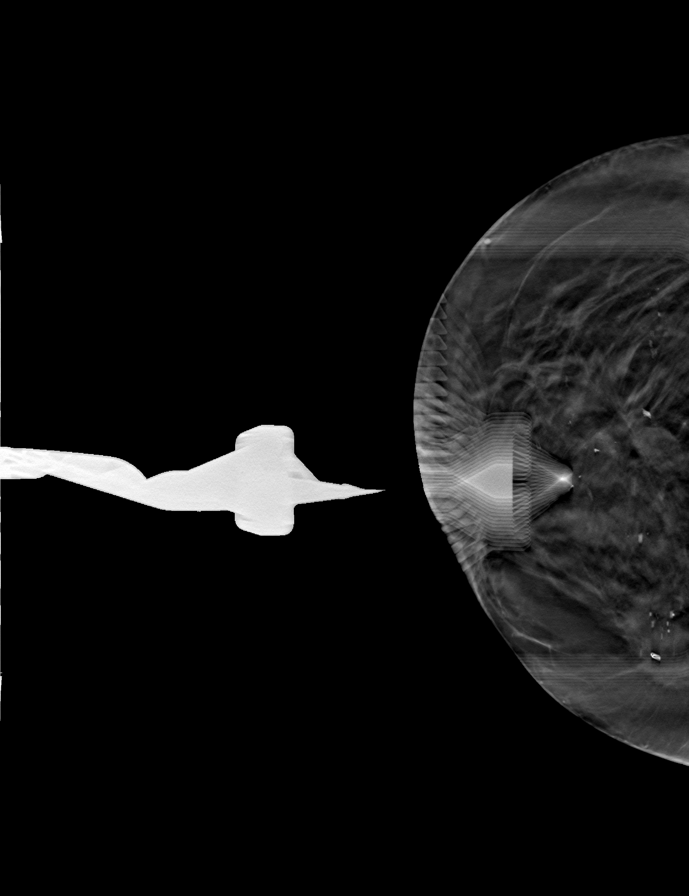

[8 of 22 positions shown; findings below may reference images not displayed]



Using sterile technique and 1% Lidocaine with and without
epinephrine as local anesthetic, under stereotactic guidance, a 9
gauge vacuum assisted device was used to perform core needle biopsy
of calcifications in the upper central left breast using a superior
approach. Specimen radiograph was performed showing calcifications.
Specimens with calcifications are identified for pathology.

Lesion quadrant: Upper inner quadrant

At the conclusion of the procedure, ribbon tissue marker clip was
deployed into the biopsy cavity. Follow-up 2-view mammogram was
performed and dictated separately.
IMPRESSION: Stereotactic-guided biopsy of calcifications in the upper central
left breast. No apparent complications.

ADDENDUM:
Pathology revealed LOBULAR NEOPLASIA (LOBULAR CARCINOMA IN SITU),
COMPLEX SCLEROSING LESION WITH CALCIFICATIONS of the LEFT breast,
central posterior (X clip). This was found to be concordant by Dr.
Churito Doylet, with excision recommended.

Pathology revealed FIBROADENOMA WITH CALCIFICATIONS, VASCULAR
CALCIFICATIONS of the LEFT breast, upper central. This was found to
be concordant by Dr. Churito Doylet.

Pathology results were discussed with the patient by telephone. The
patient reported doing well after the biopsies with tenderness at
the sites. Post biopsy instructions and care were reviewed and
questions were answered. The patient was encouraged to call The

Surgical consultation today with Dr. Duwayne Tz at [REDACTED] on 01/27/20. 01/26/20 breast biopsy results were
received after patient's scheduled appointment with Dr. Wiese. At
patient's request, [facility] notified at 430 pm 01/27/20 to contact patient
regarding most recent biopsy results.

Pathology results reported by Aklorbortu Moah RN on 01/27/2020.



Using sterile technique and 1% Lidocaine with and without
epinephrine as local anesthetic, under stereotactic guidance, a 9
gauge vacuum assisted device was used to perform core needle biopsy
of calcifications in the upper central left breast using a superior
approach. Specimen radiograph was performed showing calcifications.
Specimens with calcifications are identified for pathology.

Lesion quadrant: Upper inner quadrant

At the conclusion of the procedure, ribbon tissue marker clip was
deployed into the biopsy cavity. Follow-up 2-view mammogram was
performed and dictated separately.
IMPRESSION: Stereotactic-guided biopsy of calcifications in the upper central
left breast. No apparent complications.

## 2022-04-08 ENCOUNTER — Telehealth: Payer: Self-pay | Admitting: Hematology and Oncology

## 2022-04-08 NOTE — Telephone Encounter (Signed)
Rescheduled appointment per template. Left message with new appointment details.

## 2022-04-16 ENCOUNTER — Other Ambulatory Visit: Payer: Self-pay | Admitting: Adult Health

## 2022-04-16 DIAGNOSIS — C50412 Malignant neoplasm of upper-outer quadrant of left female breast: Secondary | ICD-10-CM

## 2022-04-17 ENCOUNTER — Inpatient Hospital Stay: Payer: HMO | Admitting: Hematology and Oncology

## 2022-04-17 NOTE — Progress Notes (Signed)
Patient Care Team: London Pepper, MD as PCP - General (Family Medicine) Nicholas Lose, MD as Consulting Physician (Hematology and Oncology) Darrold Junker, FNP (Endocrinology)  DIAGNOSIS:  Encounter Diagnosis  Name Primary?   Malignant neoplasm of upper-outer quadrant of left breast in female, estrogen receptor positive ([place])     SUMMARY OF ONCOLOGIC HISTORY: Oncology History  Malignant neoplasm of upper-outer quadrant of left breast in female, estrogen receptor positive ([place])  01/26/2020 Initial Diagnosis   Left breast biopsy: LCIS, CSL, fibroadenoma.  Foci mildly suspicious for early stromal invasion.  Pleomorphic features   02/06/2020 Cancer Staging   Staging form: Breast, AJCC 8th Edition - Clinical stage from 02/06/2020: Stage IB (cT2, cN0, cM0, G2, ER+, PR+, HER2-) - Signed by Nicholas Lose, MD on 02/06/2020   03/08/2020 Cancer Staging   Staging form: Breast, AJCC 8th Edition - Pathologic stage from 03/08/2020: Stage IA (pT2, pN0, cM0, G2, ER+, PR+, HER2-) - Signed by Gardenia Phlegm, NP on 03/21/2020   03/08/2020 Surgery   Left mastectomy ([facility]): invasive and in situ lobular carcinoma, grade 2, 3.5cm, and multifocal invasive ductal carcinoma, grade 1, 0.6cm, clear margins, 3 left axillary lymph nodes negative for carcinoma.    03/21/2020 Oncotype testing   Oncotype testing showed a score of 17/5%     CHIEF COMPLIANT:  Follow-up on Anastrozole   INTERVAL HISTORY: Patty Lopez is a 76 y.o. with above-mentioned history of left breast cancer surveillance on anastrozole.  She presents to the clinic today for a follow-up. She states that she has some hot flashes and anxiety. She has some cramps in the middle of the night in her feet. She also has some dry skin.   ALLERGIES:  has No Known Allergies.  MEDICATIONS:  Current Outpatient Medications  Medication Sig Dispense Refill   anastrozole (ARIMIDEX) 1 MG tablet Take 1 tablet by mouth once daily 90 tablet 0   Calcium  Carb-Cholecalciferol (CALCIUM + D3 PO) Take 1 tablet by mouth every evening.     clonazePAM (KLONOPIN) 1 MG tablet Take 0.5-1 mg by mouth daily as needed for anxiety.     Continuous Blood Gluc Receiver (FREESTYLE LIBRE 14 DAY READER) DEVI See admin instructions.     insulin aspart (NOVOLOG) 100 UNIT/ML injection Inject 1 Units into the skin 3 (three) times daily before meals.     LANTUS SOLOSTAR 100 UNIT/ML Solostar Pen Inject 6 Units into the skin at bedtime. Takes 6 units     levothyroxine (SYNTHROID) 75 MCG tablet Take 75 mcg by mouth every morning.     Multiple Vitamin (MULTIVITAMIN WITH MINERALS) TABS tablet Take 1 tablet by mouth every evening.     sertraline (ZOLOFT) 100 MG tablet Take 200 mg by mouth daily.     No current facility-administered medications for this visit.    PHYSICAL EXAMINATION: ECOG PERFORMANCE STATUS: 1 - Symptomatic but completely ambulatory  Vitals:   04/21/22 1216  BP: 118/64  Pulse: 83  Resp: 18  Temp: 97.6 F (36.4 C)  SpO2: 99%   Filed Weights   04/21/22 1216  Weight: 122 lb 8 oz (55.6 kg)    BREAST: No palpable masses or nodules in either right or left breasts. No palpable axillary supraclavicular or infraclavicular adenopathy no breast tenderness or nipple discharge. (exam performed in the presence of a chaperone)  LABORATORY DATA:  I have reviewed the data as listed    Latest Ref Rng & Units 03/09/2020    8:48 AM 02/29/2020  3:19 PM  CMP  Glucose 70 - 99 mg/dL 324  198   BUN 8 - 23 mg/dL 11  20   Creatinine 0.44 - 1.00 mg/dL 0.64  0.75   Sodium 135 - 145 mmol/L 138  136   Potassium 3.5 - 5.1 mmol/L 3.5  4.3   Chloride 98 - 111 mmol/L 107  102   CO2 22 - 32 mmol/L 24  25   Calcium 8.9 - 10.3 mg/dL 8.7  9.8   Total Protein 6.5 - 8.1 g/dL  7.2   Total Bilirubin 0.3 - 1.2 mg/dL  0.6   Alkaline Phos 38 - 126 U/L  48   AST 15 - 41 U/L  17   ALT 0 - 44 U/L  14     Lab Results  Component Value Date   WBC 5.4 03/09/2020   HGB 11.0 (L)  03/09/2020   HCT 33.3 (L) 03/09/2020   MCV 97.1 03/09/2020   PLT 186 03/09/2020   NEUTROABS 2.6 02/29/2020    ASSESSMENT & PLAN:  Malignant neoplasm of upper-outer quadrant of left breast in female, estrogen receptor positive ([place]) Mammogram detected left breast mass with a history of right breast cancer in 2002 treated with lumpectomy chemotherapy and radiation.  The left breast mass measured 2.6 cm.  Biopsy revealed grade 2 invasive lobular carcinoma ER/PR positive HER-2 negative with a Ki-67 of 15%.  Additional biopsies revealed LCIS and CSL.   T2N0 stage Ib   Recommendations: 1.  03/08/2020:Left mastectomy ([facility]): invasive and in situ lobular carcinoma, grade 2, 3.5cm, and multifocal invasive ductal carcinoma, grade 1, 0.6cm, clear margins, 3 left axillary lymph nodes negative for carcinoma. 2. Oncotype DX testing: Score of 17, risk of distant recurrence: 5% (no role of chemotherapy 3. Adjuvant antiestrogen therapy ---------------------------------------------------------------------------------------------------------------------------------------------- Treatment plan: Adjuvant antiestrogen therapy with anastrozole 1 mg daily x5-7 years   Anastrozole toxicities: 1.  Hot flashes 2. leg cramps  She is currently dealing with a bedbug problem at her home.  Breast cancer surveillance: 1.  Breast exam 04/21/2022: Benign.  Left mastectomy 2. right breast mammogram: 02/10/2022: Benign breast density category C  Return to clinic in 1 year for follow-up    No orders of the defined types were placed in this encounter.  The patient has a good understanding of the overall plan. she agrees with it. she will call with any problems that may develop before the next visit here. Total time spent: 30 mins including face to face time and time spent for planning, charting and co-ordination of care   Harriette Ohara, MD 04/21/22    I Gardiner Coins am scribing for Dr. Lindi Adie  I have  reviewed the above documentation for accuracy and completeness, and I agree with the above.

## 2022-04-21 ENCOUNTER — Inpatient Hospital Stay: Payer: HMO | Attending: Hematology and Oncology | Admitting: Hematology and Oncology

## 2022-04-21 ENCOUNTER — Other Ambulatory Visit: Payer: Self-pay

## 2022-04-21 DIAGNOSIS — Z170 Estrogen receptor positive status [ER+]: Secondary | ICD-10-CM | POA: Diagnosis not present

## 2022-04-21 DIAGNOSIS — Z9012 Acquired absence of left breast and nipple: Secondary | ICD-10-CM | POA: Insufficient documentation

## 2022-04-21 DIAGNOSIS — C50412 Malignant neoplasm of upper-outer quadrant of left female breast: Secondary | ICD-10-CM | POA: Insufficient documentation

## 2022-04-21 DIAGNOSIS — Z79811 Long term (current) use of aromatase inhibitors: Secondary | ICD-10-CM | POA: Insufficient documentation

## 2022-04-21 NOTE — Assessment & Plan Note (Addendum)
Mammogram detected left breast mass with a history of right breast cancer in 2002 treated with lumpectomy chemotherapy and radiation. The left breast mass measured 2.6 cm. Biopsy revealed grade 2 invasivelobularcarcinoma ER/PR positive HER-2 negative with a Ki-67 of 15%. Additional biopsies revealed LCIS and CSL.  T2N0 stage Ib  Recommendations: 1.  03/08/2020:Left mastectomy ([facility]): invasive and in situ lobular carcinoma, grade 2, 3.5cm, and multifocal invasive ductal carcinoma, grade 1, 0.6cm, clear margins, 3 left axillary lymph nodes negative for carcinoma. 2. Oncotype DX testing: Score of 17, risk of distant recurrence: 5% (no role of chemotherapy 3. Adjuvant antiestrogen therapy ---------------------------------------------------------------------------------------------------------------------------------------------- Treatment plan: Adjuvant antiestrogen therapy with anastrozole 1 mg daily x5-7 years  Anastrozole toxicities: 1.  Hot flashes 2. leg cramps  She is currently dealing with a bedbug problem at her home.  Breast cancer surveillance: 1.  Breast exam 04/21/2022: Benign.  Left mastectomy 2. right breast mammogram: 02/10/2022: Benign breast density category C  Return to clinic in 1 year for follow-up

## 2022-05-28 DIAGNOSIS — E1065 Type 1 diabetes mellitus with hyperglycemia: Secondary | ICD-10-CM | POA: Diagnosis not present

## 2022-05-28 DIAGNOSIS — Z794 Long term (current) use of insulin: Secondary | ICD-10-CM | POA: Diagnosis not present

## 2022-07-09 ENCOUNTER — Other Ambulatory Visit: Payer: Self-pay | Admitting: Hematology and Oncology

## 2022-07-09 DIAGNOSIS — Z170 Estrogen receptor positive status [ER+]: Secondary | ICD-10-CM

## 2022-09-17 DIAGNOSIS — E119 Type 2 diabetes mellitus without complications: Secondary | ICD-10-CM | POA: Diagnosis not present

## 2022-09-17 DIAGNOSIS — H35371 Puckering of macula, right eye: Secondary | ICD-10-CM | POA: Diagnosis not present

## 2022-09-17 DIAGNOSIS — H43813 Vitreous degeneration, bilateral: Secondary | ICD-10-CM | POA: Diagnosis not present

## 2022-10-01 DIAGNOSIS — E1065 Type 1 diabetes mellitus with hyperglycemia: Secondary | ICD-10-CM | POA: Diagnosis not present

## 2022-10-11 ENCOUNTER — Other Ambulatory Visit: Payer: Self-pay | Admitting: Hematology and Oncology

## 2022-10-11 DIAGNOSIS — C50412 Malignant neoplasm of upper-outer quadrant of left female breast: Secondary | ICD-10-CM

## 2022-11-27 DIAGNOSIS — J22 Unspecified acute lower respiratory infection: Secondary | ICD-10-CM | POA: Diagnosis not present

## 2022-12-16 ENCOUNTER — Telehealth: Payer: PPO | Admitting: Physician Assistant

## 2022-12-16 DIAGNOSIS — M545 Low back pain, unspecified: Secondary | ICD-10-CM | POA: Diagnosis not present

## 2022-12-16 MED ORDER — TIZANIDINE HCL 2 MG PO CAPS
2.0000 mg | ORAL_CAPSULE | Freq: Three times a day (TID) | ORAL | 0 refills | Status: AC
Start: 2022-12-16 — End: ?

## 2022-12-16 MED ORDER — MELOXICAM 7.5 MG PO TABS
7.5000 mg | ORAL_TABLET | Freq: Every day | ORAL | 0 refills | Status: AC
Start: 2022-12-16 — End: ?

## 2022-12-16 NOTE — Patient Instructions (Signed)
Patty Lopez, thank you for joining Patty Loveless, PA-C for today's virtual visit.  While this provider is not your primary care provider (PCP), if your PCP is located in our provider database this encounter information will be shared with them immediately following your visit.   A [facility] MyChart account gives you access to today's visit and all your visits, tests, and labs performed at [facility] " click here if you don't have a [facility] MyChart account or go to [email or web address]  Consent: (Patient) Patty Lopez provided verbal consent for this virtual visit at the beginning of the encounter.  Current Medications:  Current Outpatient Medications:    meloxicam (MOBIC) 7.5 MG tablet, Take 1 tablet (7.5 mg total) by mouth daily., Disp: 30 tablet, Rfl: 0   tizanidine (ZANAFLEX) 2 MG capsule, Take 1 capsule (2 mg total) by mouth 3 (three) times daily., Disp: 30 capsule, Rfl: 0   anastrozole (ARIMIDEX) 1 MG tablet, Take 1 tablet by mouth once daily, Disp: 90 tablet, Rfl: 0   Calcium Carb-Cholecalciferol (CALCIUM + D3 PO), Take 1 tablet by mouth every evening., Disp: , Rfl:    clonazePAM (KLONOPIN) 1 MG tablet, Take 0.5-1 mg by mouth daily as needed for anxiety., Disp: , Rfl:    Continuous Blood Gluc Receiver (FREESTYLE LIBRE 14 DAY READER) DEVI, See admin instructions., Disp: , Rfl:    insulin aspart (NOVOLOG) 100 UNIT/ML injection, Inject 1 Units into the skin 3 (three) times daily before meals., Disp: , Rfl:    LANTUS SOLOSTAR 100 UNIT/ML Solostar Pen, Inject 6 Units into the skin at bedtime. Takes 6 units, Disp: , Rfl:    levothyroxine (SYNTHROID) 75 MCG tablet, Take 75 mcg by mouth every morning., Disp: , Rfl:    Multiple Vitamin (MULTIVITAMIN WITH MINERALS) TABS tablet, Take 1 tablet by mouth every evening., Disp: , Rfl:    sertraline (ZOLOFT) 100 MG tablet, Take 200 mg by mouth daily., Disp: , Rfl:    Medications ordered in this encounter:  Meds  ordered this encounter  Medications   tizanidine (ZANAFLEX) 2 MG capsule    Sig: Take 1 capsule (2 mg total) by mouth 3 (three) times daily.    Dispense:  30 capsule    Refill:  0    Order Specific Question:   Supervising Provider    Answer:   Merrilee Jansky [[id number]]   meloxicam (MOBIC) 7.5 MG tablet    Sig: Take 1 tablet (7.5 mg total) by mouth daily.    Dispense:  30 tablet    Refill:  0    Order Specific Question:   Supervising Provider    Answer:   Merrilee Jansky [id number]     *If you need refills on other medications prior to your next appointment, please contact your pharmacy*  Follow-Up: Call back or seek an in-person evaluation if the symptoms worsen or if the condition fails to improve as anticipated.  [facility] [phone]  Other Instructions  Back Exercises These exercises help to make your trunk and back strong. They also help to keep the lower back flexible. Doing these exercises can help to prevent or lessen pain in your lower back. If you have back pain, try to do these exercises 2-3 times each day or as told by your doctor. As you get better, do the exercises once each day. Repeat the exercises more often as told by your doctor. To stop back pain from coming back, do the  exercises once each day, or as told by your doctor. Do exercises exactly as told by your doctor. Stop right away if you feel sudden pain or your pain gets worse. Exercises Single knee to chest Do these steps 3-5 times in a row for each leg: Lie on your back on a firm bed or the floor with your legs stretched out. Bring one knee to your chest. Grab your knee or thigh with both hands and hold it in place. Pull on your knee until you feel a gentle stretch in your lower back or butt. Keep doing the stretch for 10-30 seconds. Slowly let go of your leg and straighten it. Pelvic tilt Do these steps 5-10 times in a row: Lie on your back on a firm bed or the floor with your  legs stretched out. Bend your knees so they point up to the ceiling. Your feet should be flat on the floor. Tighten your lower belly (abdomen) muscles to press your lower back against the floor. This will make your tailbone point up to the ceiling instead of pointing down to your feet or the floor. Stay in this position for 5-10 seconds while you gently tighten your muscles and breathe evenly. Cat-cow Do these steps until your lower back bends more easily: Get on your hands and knees on a firm bed or the floor. Keep your hands under your shoulders, and keep your knees under your hips. You may put padding under your knees. Let your head hang down toward your chest. Tighten (contract) the muscles in your belly. Point your tailbone toward the floor so your lower back becomes rounded like the back of a cat. Stay in this position for 5 seconds. Slowly lift your head. Let the muscles of your belly relax. Point your tailbone up toward the ceiling so your back forms a sagging arch like the back of a cow. Stay in this position for 5 seconds.  Press-ups Do these steps 5-10 times in a row: Lie on your belly (face-down) on a firm bed or the floor. Place your hands near your head, about shoulder-width apart. While you keep your back relaxed and keep your hips on the floor, slowly straighten your arms to raise the top half of your body and lift your shoulders. Do not use your back muscles. You may change where you place your hands to make yourself more comfortable. Stay in this position for 5 seconds. Keep your back relaxed. Slowly return to lying flat on the floor.  Bridges Do these steps 10 times in a row: Lie on your back on a firm bed or the floor. Bend your knees so they point up to the ceiling. Your feet should be flat on the floor. Your arms should be flat at your sides, next to your body. Tighten your butt muscles and lift your butt off the floor until your waist is almost as high as your knees. If  you do not feel the muscles working in your butt and the back of your thighs, slide your feet 1-2 inches (2.5-5 cm) farther away from your butt. Stay in this position for 3-5 seconds. Slowly lower your butt to the floor, and let your butt muscles relax. If this exercise is too easy, try doing it with your arms crossed over your chest. Belly crunches Do these steps 5-10 times in a row: Lie on your back on a firm bed or the floor with your legs stretched out. Bend your knees so they point up  to the ceiling. Your feet should be flat on the floor. Cross your arms over your chest. Tip your chin a little bit toward your chest, but do not bend your neck. Tighten your belly muscles and slowly raise your chest just enough to lift your shoulder blades a tiny bit off the floor. Avoid raising your body higher than that because it can put too much stress on your lower back. Slowly lower your chest and your head to the floor. Back lifts Do these steps 5-10 times in a row: Lie on your belly (face-down) with your arms at your sides, and rest your forehead on the floor. Tighten the muscles in your legs and your butt. Slowly lift your chest off the floor while you keep your hips on the floor. Keep the back of your head in line with the curve in your back. Look at the floor while you do this. Stay in this position for 3-5 seconds. Slowly lower your chest and your face to the floor. Contact a doctor if: Your back pain gets a lot worse when you do an exercise. Your back pain does not get better within 2 hours after you exercise. If you have any of these problems, stop doing the exercises. Do not do them again unless your doctor says it is okay. Get help right away if: You have sudden, very bad back pain. If this happens, stop doing the exercises. Do not do them again unless your doctor says it is okay. This information is not intended to replace advice given to you by your health care provider. Make sure you  discuss any questions you have with your health care provider. Document Revised: 10/31/2020 Document Reviewed: 10/31/2020 Elsevier Patient Education  2023 Elsevier Inc.    If you have been instructed to have an in-person evaluation today at a local Urgent Care facility, please use the link below. It will take you to a list of all of our available [facility], including address, phone number and hours of operation. Please do not delay care.  [facility]  If you or a family member do not have a primary care provider, use the link below to schedule a visit and establish care. When you choose a [facility] primary care physician or advanced practice provider, you gain a long-term partner in health. Find a Primary Care Provider  Learn more about [facility]'s in-office and virtual care options: [facility] - Get Care Now

## 2022-12-16 NOTE — Progress Notes (Signed)
Virtual Visit Consent   Patty Lopez, you are scheduled for a virtual visit with a [facility] provider today. Just as with appointments in the office, your consent must be obtained to participate. Your consent will be active for this visit and any virtual visit you may have with one of our providers in the next 365 days. If you have a MyChart account, a copy of this consent can be sent to you electronically.  As this is a virtual visit, video technology does not allow for your provider to perform a traditional examination. This may limit your provider's ability to fully assess your condition. If your provider identifies any concerns that need to be evaluated in person or the need to arrange testing (such as labs, EKG, etc.), we will make arrangements to do so. Although advances in technology are sophisticated, we cannot ensure that it will always work on either your end or our end. If the connection with a video visit is poor, the visit may have to be switched to a telephone visit. With either a video or telephone visit, we are not always able to ensure that we have a secure connection.  By engaging in this virtual visit, you consent to the provision of healthcare and authorize for your insurance to be billed (if applicable) for the services provided during this visit. Depending on your insurance coverage, you may receive a charge related to this service.  I need to obtain your verbal consent now. Are you willing to proceed with your visit today? Patty Lopez has provided verbal consent on 12/16/2022 for a virtual visit (video or telephone). [name], PA-C  Date: 12/16/2022 11:57 AM  Virtual Visit via Video Note   I, [name], connected with  Patty Lopez  ([id number], Jan 24, 1946) on 12/16/22 at 11:45 AM EDT by a video-enabled telemedicine application and verified that I am speaking with the correct person using two identifiers.  Location: Patient: Virtual Visit Location  Patient: Home Provider: Virtual Visit Location Provider: Home Office   I discussed the limitations of evaluation and management by telemedicine and the availability of in person appointments. The patient expressed understanding and agreed to proceed.    History of Present Illness: Patty Lopez is a 77 y.o. who identifies as a female who was assigned female at birth, and is being seen today for back pain.  HPI: Back Pain This is a new problem. The current episode started in the past 7 days. The problem occurs constantly. The problem has been gradually worsening since onset. The pain is present in the lumbar spine (left sided). The quality of the pain is described as aching and stabbing. The pain does not radiate. The pain is moderate. The pain is The same all the time. The symptoms are aggravated by coughing, position, twisting, lying down and bending (anything to the left). Stiffness is present All day. Pertinent negatives include no bladder incontinence, bowel incontinence, dysuria, fever, leg pain, numbness, paresthesias, pelvic pain, perianal numbness, tingling or weakness. She has tried heat and ice (tylenol) for the symptoms. The treatment provided no relief.     Problems:  Patient Active Problem List   Diagnosis Date Noted   Breast cancer, stage 2, left 03/08/2020   Newly diagnosed diabetes 02/17/2020   Malignant neoplasm of upper-outer quadrant of left breast in female, estrogen receptor positive 02/06/2020    Allergies: No Known Allergies Medications:  Current Outpatient Medications:    meloxicam (MOBIC) 7.5 MG tablet, Take  1 tablet (7.5 mg total) by mouth daily., Disp: 30 tablet, Rfl: 0   tizanidine (ZANAFLEX) 2 MG capsule, Take 1 capsule (2 mg total) by mouth 3 (three) times daily., Disp: 30 capsule, Rfl: 0   anastrozole (ARIMIDEX) 1 MG tablet, Take 1 tablet by mouth once daily, Disp: 90 tablet, Rfl: 0   Calcium Carb-Cholecalciferol (CALCIUM + D3 PO), Take 1 tablet by mouth every  evening., Disp: , Rfl:    clonazePAM (KLONOPIN) 1 MG tablet, Take 0.5-1 mg by mouth daily as needed for anxiety., Disp: , Rfl:    Continuous Blood Gluc Receiver (FREESTYLE LIBRE 14 DAY READER) DEVI, See admin instructions., Disp: , Rfl:    insulin aspart (NOVOLOG) 100 UNIT/ML injection, Inject 1 Units into the skin 3 (three) times daily before meals., Disp: , Rfl:    LANTUS SOLOSTAR 100 UNIT/ML Solostar Pen, Inject 6 Units into the skin at bedtime. Takes 6 units, Disp: , Rfl:    levothyroxine (SYNTHROID) 75 MCG tablet, Take 75 mcg by mouth every morning., Disp: , Rfl:    Multiple Vitamin (MULTIVITAMIN WITH MINERALS) TABS tablet, Take 1 tablet by mouth every evening., Disp: , Rfl:    sertraline (ZOLOFT) 100 MG tablet, Take 200 mg by mouth daily., Disp: , Rfl:   Observations/Objective: Patient is well-developed, well-nourished in no acute distress.  Resting comfortably at home.  Head is normocephalic, atraumatic.  No labored breathing.  Speech is clear and coherent with logical content.  Patient is alert and oriented at baseline.    Assessment and Plan: 1. Acute left-sided low back pain without sciatica - tizanidine (ZANAFLEX) 2 MG capsule; Take 1 capsule (2 mg total) by mouth 3 (three) times daily.  Dispense: 30 capsule; Refill: 0 - meloxicam (MOBIC) 7.5 MG tablet; Take 1 tablet (7.5 mg total) by mouth daily.  Dispense: 30 tablet; Refill: 0  - Suspect lumbar strain - Add Tizanidine and Meloxicam - Tylenol if okay for breakthrough pain - Heat to area - Epsom salt soak if able to get in and out of bath tub safely - Back exercises and stretches provided via AVS - Seek in person evaluation if worsening or fails to improve with treatment   Follow Up Instructions: I discussed the assessment and treatment plan with the patient. The patient was provided an opportunity to ask questions and all were answered. The patient agreed with the plan and demonstrated an understanding of the  instructions.  A copy of instructions were sent to the patient via MyChart unless otherwise noted below.    The patient was advised to call back or seek an in-person evaluation if the symptoms worsen or if the condition fails to improve as anticipated.  Time:  I spent 13 minutes with the patient via telehealth technology discussing the above problems/concerns.    [name], PA-C

## 2022-12-19 ENCOUNTER — Other Ambulatory Visit: Payer: Self-pay | Admitting: Family Medicine

## 2022-12-19 ENCOUNTER — Telehealth: Payer: HMO | Admitting: Family Medicine

## 2022-12-19 DIAGNOSIS — Z0000 Encounter for general adult medical examination without abnormal findings: Secondary | ICD-10-CM

## 2022-12-19 DIAGNOSIS — M545 Low back pain, unspecified: Secondary | ICD-10-CM

## 2022-12-19 NOTE — Progress Notes (Signed)
[place]   Needs to follow up in person- given pain is not improving much.  Patient acknowledged agreement and understanding of the plan.

## 2022-12-30 DIAGNOSIS — M545 Low back pain, unspecified: Secondary | ICD-10-CM | POA: Diagnosis not present

## 2023-01-09 ENCOUNTER — Other Ambulatory Visit: Payer: Self-pay | Admitting: Hematology and Oncology

## 2023-01-09 DIAGNOSIS — Z170 Estrogen receptor positive status [ER+]: Secondary | ICD-10-CM

## 2023-01-13 ENCOUNTER — Other Ambulatory Visit: Payer: Self-pay | Admitting: Family Medicine

## 2023-01-13 DIAGNOSIS — M549 Dorsalgia, unspecified: Secondary | ICD-10-CM | POA: Diagnosis not present

## 2023-01-13 DIAGNOSIS — C50912 Malignant neoplasm of unspecified site of left female breast: Secondary | ICD-10-CM | POA: Diagnosis not present

## 2023-01-13 DIAGNOSIS — E039 Hypothyroidism, unspecified: Secondary | ICD-10-CM | POA: Diagnosis not present

## 2023-01-13 DIAGNOSIS — D8481 Immunodeficiency due to conditions classified elsewhere: Secondary | ICD-10-CM | POA: Diagnosis not present

## 2023-01-13 DIAGNOSIS — C50919 Malignant neoplasm of unspecified site of unspecified female breast: Secondary | ICD-10-CM | POA: Diagnosis not present

## 2023-01-13 DIAGNOSIS — K429 Umbilical hernia without obstruction or gangrene: Secondary | ICD-10-CM | POA: Diagnosis not present

## 2023-01-13 DIAGNOSIS — Z1211 Encounter for screening for malignant neoplasm of colon: Secondary | ICD-10-CM | POA: Diagnosis not present

## 2023-01-13 DIAGNOSIS — Z0000 Encounter for general adult medical examination without abnormal findings: Secondary | ICD-10-CM | POA: Diagnosis not present

## 2023-01-13 DIAGNOSIS — R109 Unspecified abdominal pain: Secondary | ICD-10-CM | POA: Diagnosis not present

## 2023-01-13 DIAGNOSIS — R1011 Right upper quadrant pain: Secondary | ICD-10-CM

## 2023-01-13 DIAGNOSIS — F411 Generalized anxiety disorder: Secondary | ICD-10-CM | POA: Diagnosis not present

## 2023-01-13 DIAGNOSIS — E1165 Type 2 diabetes mellitus with hyperglycemia: Secondary | ICD-10-CM | POA: Diagnosis not present

## 2023-01-21 ENCOUNTER — Ambulatory Visit
Admission: RE | Admit: 2023-01-21 | Discharge: 2023-01-21 | Disposition: A | Discharge status: Home / Self Care | Payer: PPO | Source: Ambulatory Visit | Attending: Family Medicine | Admitting: Family Medicine

## 2023-01-21 DIAGNOSIS — R109 Unspecified abdominal pain: Secondary | ICD-10-CM | POA: Diagnosis not present

## 2023-01-21 DIAGNOSIS — R1011 Right upper quadrant pain: Secondary | ICD-10-CM

## 2023-01-21 DIAGNOSIS — K8020 Calculus of gallbladder without cholecystitis without obstruction: Secondary | ICD-10-CM | POA: Diagnosis not present

## 2023-01-22 ENCOUNTER — Other Ambulatory Visit: Payer: PPO

## 2023-02-02 DIAGNOSIS — E1065 Type 1 diabetes mellitus with hyperglycemia: Secondary | ICD-10-CM | POA: Diagnosis not present

## 2023-02-12 ENCOUNTER — Ambulatory Visit
Admission: RE | Admit: 2023-02-12 | Discharge: 2023-02-12 | Disposition: A | Discharge status: Home / Self Care | Payer: PPO | Source: Ambulatory Visit | Attending: Family Medicine | Admitting: Family Medicine

## 2023-02-12 DIAGNOSIS — Z0000 Encounter for general adult medical examination without abnormal findings: Secondary | ICD-10-CM

## 2023-02-12 DIAGNOSIS — Z1231 Encounter for screening mammogram for malignant neoplasm of breast: Secondary | ICD-10-CM | POA: Diagnosis not present

## 2023-03-02 DIAGNOSIS — K429 Umbilical hernia without obstruction or gangrene: Secondary | ICD-10-CM | POA: Diagnosis not present

## 2023-04-22 ENCOUNTER — Ambulatory Visit: Payer: HMO | Admitting: Hematology and Oncology

## 2023-05-18 ENCOUNTER — Ambulatory Visit: Payer: Self-pay | Admitting: Hematology and Oncology

## 2023-06-02 ENCOUNTER — Inpatient Hospital Stay: Payer: PPO | Attending: Hematology and Oncology | Admitting: Hematology and Oncology

## 2023-06-02 DIAGNOSIS — E119 Type 2 diabetes mellitus without complications: Secondary | ICD-10-CM | POA: Diagnosis not present

## 2023-06-02 DIAGNOSIS — C50412 Malignant neoplasm of upper-outer quadrant of left female breast: Secondary | ICD-10-CM | POA: Diagnosis not present

## 2023-06-02 DIAGNOSIS — Z9012 Acquired absence of left breast and nipple: Secondary | ICD-10-CM | POA: Insufficient documentation

## 2023-06-02 DIAGNOSIS — Z79811 Long term (current) use of aromatase inhibitors: Secondary | ICD-10-CM | POA: Diagnosis not present

## 2023-06-02 DIAGNOSIS — Z79899 Other long term (current) drug therapy: Secondary | ICD-10-CM | POA: Diagnosis not present

## 2023-06-02 DIAGNOSIS — Z794 Long term (current) use of insulin: Secondary | ICD-10-CM | POA: Insufficient documentation

## 2023-06-02 DIAGNOSIS — Z170 Estrogen receptor positive status [ER+]: Secondary | ICD-10-CM | POA: Diagnosis not present

## 2023-06-02 MED ORDER — ANASTROZOLE 1 MG PO TABS
1.0000 mg | ORAL_TABLET | Freq: Every day | ORAL | 3 refills | Status: DC
Start: 2023-06-02 — End: 2024-07-11

## 2023-06-02 NOTE — Assessment & Plan Note (Signed)
Mammogram detected left breast mass with a history of right breast cancer in 2002 treated with lumpectomy chemotherapy and radiation.  The left breast mass measured 2.6 cm.  Biopsy revealed grade 2 invasive lobular carcinoma ER/PR positive HER-2 negative with a Ki-67 of 15%.  Additional biopsies revealed LCIS and CSL.   T2N0 stage Ib   Recommendations: 1.  03/08/2020:Left mastectomy ([facility]): invasive and in situ lobular carcinoma, grade 2, 3.5cm, and multifocal invasive ductal carcinoma, grade 1, 0.6cm, clear margins, 3 left axillary lymph nodes negative for carcinoma. 2. Oncotype DX testing: Score of 17, risk of distant recurrence: 5% (no role of chemotherapy 3. Adjuvant antiestrogen therapy ---------------------------------------------------------------------------------------------------------------------------------------------- Treatment plan: Adjuvant antiestrogen therapy with anastrozole 1 mg daily x5-7 years   Anastrozole toxicities: 1.  Hot flashes 2. leg cramps   Breast cancer surveillance: right breast mammogram: 02/10/2022: Benign breast density category C   Return to clinic in 1 year for follow-up

## 2023-06-03 NOTE — Progress Notes (Signed)
Patient Care Team: Farris Has, MD as PCP - General (Family Medicine) Serena Croissant, MD as Consulting Physician (Hematology and Oncology) Maggie Font, FNP (Endocrinology)  DIAGNOSIS:  Encounter Diagnosis  Name Primary?   Malignant neoplasm of upper-outer quadrant of left breast in female, estrogen receptor positive (HCC)     SUMMARY OF ONCOLOGIC HISTORY: Oncology History  Malignant neoplasm of upper-outer quadrant of left breast in female, estrogen receptor positive (HCC)  01/26/2020 Initial Diagnosis   Left breast biopsy: LCIS, CSL, fibroadenoma.  Foci mildly suspicious for early stromal invasion.  Pleomorphic features   02/06/2020 Cancer Staging   Staging form: Breast, AJCC 8th Edition - Clinical stage from 02/06/2020: Stage IB (cT2, cN0, cM0, G2, ER+, PR+, HER2-) - Signed by Serena Croissant, MD on 02/06/2020   03/08/2020 Cancer Staging   Staging form: Breast, AJCC 8th Edition - Pathologic stage from 03/08/2020: Stage IA (pT2, pN0, cM0, G2, ER+, PR+, HER2-) - Signed by Loa Socks, NP on 03/21/2020   03/08/2020 Surgery   Left mastectomy ([facility]): invasive and in situ lobular carcinoma, grade 2, 3.5cm, and multifocal invasive ductal carcinoma, grade 1, 0.6cm, clear margins, 3 left axillary lymph nodes negative for carcinoma.    03/21/2020 Oncotype testing   Oncotype testing showed a score of 17/5%     CHIEF COMPLIANT: Follow-up on anastrozole therapy  History of Present Illness   The patient, a breast cancer survivor, is in her fourth year of anastrozole therapy. She reports hot flashes and increased anxiety as side effects of the medication. She also expresses concern about the medication's potential impact on her cholesterol levels, as she has noticed an increase since starting the therapy.  In addition to the breast cancer, the patient has diabetes and has been experiencing more frequent bowel movements, which she believes may be related to her diabetes. The  frequency and consistency of the bowel movements vary, but she can go up to three or four times in the morning.  The patient also mentions a hernia, which she has seen a doctor about. She has not experienced any recent issues with it, but she can see and feel it.  She also has a history of high cholesterol, which she is managing with a statin medication. She has noticed a gradual increase in her cholesterol levels over time, despite efforts to manage her diet.  The patient also mentions having dense breasts, which she was informed about prior to her breast cancer diagnosis. She expresses concern about the potential impact of this on her risk of future breast cancer.         ALLERGIES:  has No Known Allergies.  MEDICATIONS:  Current Outpatient Medications  Medication Sig Dispense Refill   ADACEL 12-31-13.5 LF-MCG/0.5 injection      anastrozole (ARIMIDEX) 1 MG tablet Take 1 tablet (1 mg total) by mouth daily. 90 tablet 3   Calcium Carb-Cholecalciferol (CALCIUM + D3 PO) Take 1 tablet by mouth every evening.     clonazePAM (KLONOPIN) 1 MG tablet Take 0.5-1 mg by mouth daily as needed for anxiety.     Continuous Blood Gluc Receiver (FREESTYLE LIBRE 14 DAY READER) DEVI See admin instructions.     insulin aspart (NOVOLOG) 100 UNIT/ML injection Inject 1 Units into the skin 3 (three) times daily before meals.     LANTUS SOLOSTAR 100 UNIT/ML Solostar Pen Inject 6 Units into the skin at bedtime. Takes 6 units     levothyroxine (SYNTHROID) 75 MCG tablet Take 75 mcg by mouth  every morning.     Multiple Vitamin (MULTIVITAMIN WITH MINERALS) TABS tablet Take 1 tablet by mouth every evening.     rosuvastatin (CRESTOR) 5 MG tablet Take 5 mg by mouth 3 (three) times a week.     sertraline (ZOLOFT) 100 MG tablet Take 200 mg by mouth daily.     No current facility-administered medications for this visit.    PHYSICAL EXAMINATION: ECOG PERFORMANCE STATUS: 1 - Symptomatic but completely ambulatory  Vitals:    06/02/23 1512  BP: 130/70  Pulse: 70  Resp: 18  Temp: 97.7 F (36.5 C)  SpO2: 100%   Filed Weights   06/02/23 1512  Weight: 124 lb (56.2 kg)      LABORATORY DATA:  I have reviewed the data as listed    Latest Ref Rng & Units 03/09/2020    8:48 AM 02/29/2020    3:19 PM  CMP  Glucose 70 - 99 mg/dL 161  096   BUN 8 - 23 mg/dL 11  20   Creatinine 0.45 - 1.00 mg/dL 4.09  8.11   Sodium 914 - 145 mmol/L 138  136   Potassium 3.5 - 5.1 mmol/L 3.5  4.3   Chloride 98 - 111 mmol/L 107  102   CO2 22 - 32 mmol/L 24  25   Calcium 8.9 - 10.3 mg/dL 8.7  9.8   Total Protein 6.5 - 8.1 g/dL  7.2   Total Bilirubin 0.3 - 1.2 mg/dL  0.6   Alkaline Phos 38 - 126 U/L  48   AST 15 - 41 U/L  17   ALT 0 - 44 U/L  14     Lab Results  Component Value Date   WBC 5.4 03/09/2020   HGB 11.0 (L) 03/09/2020   HCT 33.3 (L) 03/09/2020   MCV 97.1 03/09/2020   PLT 186 03/09/2020   NEUTROABS 2.6 02/29/2020    ASSESSMENT & PLAN:  Malignant neoplasm of upper-outer quadrant of left breast in female, estrogen receptor positive (HCC) Mammogram detected left breast mass with a history of right breast cancer in 2002 treated with lumpectomy chemotherapy and radiation.  The left breast mass measured 2.6 cm.  Biopsy revealed grade 2 invasive lobular carcinoma ER/PR positive HER-2 negative with a Ki-67 of 15%.  Additional biopsies revealed LCIS and CSL.   T2N0 stage Ib   Recommendations: 1.  03/08/2020:Left mastectomy ([facility]): invasive and in situ lobular carcinoma, grade 2, 3.5cm, and multifocal invasive ductal carcinoma, grade 1, 0.6cm, clear margins, 3 left axillary lymph nodes negative for carcinoma. 2. Oncotype DX testing: Score of 17, risk of distant recurrence: 5% (no role of chemotherapy 3. Adjuvant antiestrogen therapy ---------------------------------------------------------------------------------------------------------------------------------------------- Treatment plan: Adjuvant antiestrogen  therapy with anastrozole 1 mg daily x5-7 years   Anastrozole toxicities: 1.  Hot flashes 2. leg cramps   Breast cancer surveillance: right breast mammogram: 02/10/2022: Benign breast density category C     Breast Cancer Over three years since diagnosis, tolerating Anastrozole well with minor side effects including hot flashes and joint stiffness. Mammograms have been satisfactory. -Continue Anastrozole. -Next follow-up in one year.  Hyperlipidemia Patient reports increase in cholesterol levels, currently on Atorvastatin. -Continue Atorvastatin. -Encourage patient to maintain a balanced diet.  Diabetes Patient reports frequent bowel movements, possibly related to diabetes. Patient is on a regimen of fast-acting and slow-acting insulin. -Continue current insulin regimen. -Encourage patient to monitor blood sugar levels, especially during periods of physical activity.  Bunions Patient reports discomfort due to bunions, but is managing  with appropriate footwear. -Continue current management strategy.  Gallstones Identified on previous ultrasound, but not infected. -No current action required, continue monitoring.  Hernia Patient reports a possible hernia, but no current discomfort. -No current action required, continue monitoring.      Return to clinic in 1 year for follow-up  No orders of the defined types were placed in this encounter.  The patient has a good understanding of the overall plan. she agrees with it. she will call with any problems that may develop before the next visit here. Total time spent: 30 mins including face to face time and time spent for planning, charting and co-ordination of care   Tamsen Meek, MD 06/03/23

## 2023-06-09 DIAGNOSIS — E1065 Type 1 diabetes mellitus with hyperglycemia: Secondary | ICD-10-CM | POA: Diagnosis not present

## 2023-07-09 DIAGNOSIS — K621 Rectal polyp: Secondary | ICD-10-CM | POA: Diagnosis not present

## 2023-07-09 DIAGNOSIS — Z1211 Encounter for screening for malignant neoplasm of colon: Secondary | ICD-10-CM | POA: Diagnosis not present

## 2023-07-09 DIAGNOSIS — K635 Polyp of colon: Secondary | ICD-10-CM | POA: Diagnosis not present

## 2023-07-09 DIAGNOSIS — E1065 Type 1 diabetes mellitus with hyperglycemia: Secondary | ICD-10-CM | POA: Diagnosis not present

## 2023-07-09 DIAGNOSIS — D123 Benign neoplasm of transverse colon: Secondary | ICD-10-CM | POA: Diagnosis not present

## 2023-07-09 DIAGNOSIS — Z79899 Other long term (current) drug therapy: Secondary | ICD-10-CM | POA: Diagnosis not present

## 2023-07-09 DIAGNOSIS — K5730 Diverticulosis of large intestine without perforation or abscess without bleeding: Secondary | ICD-10-CM | POA: Diagnosis not present

## 2023-07-09 DIAGNOSIS — Z87891 Personal history of nicotine dependence: Secondary | ICD-10-CM | POA: Diagnosis not present

## 2023-07-09 DIAGNOSIS — D120 Benign neoplasm of cecum: Secondary | ICD-10-CM | POA: Diagnosis not present

## 2023-07-09 DIAGNOSIS — Z860100 Personal history of colon polyps, unspecified: Secondary | ICD-10-CM | POA: Diagnosis not present

## 2023-07-09 DIAGNOSIS — E039 Hypothyroidism, unspecified: Secondary | ICD-10-CM | POA: Diagnosis not present

## 2023-07-09 DIAGNOSIS — D122 Benign neoplasm of ascending colon: Secondary | ICD-10-CM | POA: Diagnosis not present

## 2023-07-10 ENCOUNTER — Other Ambulatory Visit: Payer: Self-pay

## 2023-07-10 ENCOUNTER — Inpatient Hospital Stay (HOSPITAL_BASED_OUTPATIENT_CLINIC_OR_DEPARTMENT_OTHER)
Admission: EM | Admit: 2023-07-10 | Discharge: 2023-07-13 | DRG: 639 | Disposition: A | Discharge status: Home / Self Care | Payer: PPO | Attending: Internal Medicine | Admitting: Internal Medicine

## 2023-07-10 DIAGNOSIS — C50912 Malignant neoplasm of unspecified site of left female breast: Secondary | ICD-10-CM | POA: Diagnosis present

## 2023-07-10 DIAGNOSIS — Z853 Personal history of malignant neoplasm of breast: Secondary | ICD-10-CM

## 2023-07-10 DIAGNOSIS — E1110 Type 2 diabetes mellitus with ketoacidosis without coma: Secondary | ICD-10-CM | POA: Diagnosis present

## 2023-07-10 DIAGNOSIS — E10649 Type 1 diabetes mellitus with hypoglycemia without coma: Secondary | ICD-10-CM | POA: Diagnosis not present

## 2023-07-10 DIAGNOSIS — Z79899 Other long term (current) drug therapy: Secondary | ICD-10-CM

## 2023-07-10 DIAGNOSIS — E876 Hypokalemia: Secondary | ICD-10-CM | POA: Diagnosis not present

## 2023-07-10 DIAGNOSIS — Z9221 Personal history of antineoplastic chemotherapy: Secondary | ICD-10-CM

## 2023-07-10 DIAGNOSIS — Z79811 Long term (current) use of aromatase inhibitors: Secondary | ICD-10-CM | POA: Diagnosis not present

## 2023-07-10 DIAGNOSIS — Z79890 Hormone replacement therapy (postmenopausal): Secondary | ICD-10-CM

## 2023-07-10 DIAGNOSIS — E1010 Type 1 diabetes mellitus with ketoacidosis without coma: Secondary | ICD-10-CM | POA: Diagnosis not present

## 2023-07-10 DIAGNOSIS — E039 Hypothyroidism, unspecified: Secondary | ICD-10-CM | POA: Diagnosis present

## 2023-07-10 DIAGNOSIS — E0810 Diabetes mellitus due to underlying condition with ketoacidosis without coma: Secondary | ICD-10-CM | POA: Diagnosis not present

## 2023-07-10 DIAGNOSIS — F32A Depression, unspecified: Secondary | ICD-10-CM | POA: Diagnosis present

## 2023-07-10 DIAGNOSIS — E785 Hyperlipidemia, unspecified: Secondary | ICD-10-CM | POA: Diagnosis not present

## 2023-07-10 DIAGNOSIS — D72829 Elevated white blood cell count, unspecified: Secondary | ICD-10-CM | POA: Diagnosis present

## 2023-07-10 DIAGNOSIS — Z87891 Personal history of nicotine dependence: Secondary | ICD-10-CM | POA: Diagnosis not present

## 2023-07-10 DIAGNOSIS — Z9012 Acquired absence of left breast and nipple: Secondary | ICD-10-CM

## 2023-07-10 DIAGNOSIS — Z923 Personal history of irradiation: Secondary | ICD-10-CM

## 2023-07-10 DIAGNOSIS — Z794 Long term (current) use of insulin: Secondary | ICD-10-CM | POA: Diagnosis not present

## 2023-07-10 DIAGNOSIS — F419 Anxiety disorder, unspecified: Secondary | ICD-10-CM | POA: Diagnosis present

## 2023-07-10 DIAGNOSIS — R9431 Abnormal electrocardiogram [ECG] [EKG]: Secondary | ICD-10-CM | POA: Diagnosis not present

## 2023-07-10 LAB — I-STAT VENOUS BLOOD GAS, ED
Acid-base deficit: 11 mmol/L — ABNORMAL HIGH (ref 0.0–2.0)
Bicarbonate: 14 mmol/L — ABNORMAL LOW (ref 20.0–28.0)
Calcium, Ion: 1.29 mmol/L (ref 1.15–1.40)
HCT: 41 % (ref 36.0–46.0)
Hemoglobin: 13.9 g/dL (ref 12.0–15.0)
O2 Saturation: 75 %
Potassium: 4.9 mmol/L (ref 3.5–5.1)
Sodium: 133 mmol/L — ABNORMAL LOW (ref 135–145)
TCO2: 15 mmol/L — ABNORMAL LOW (ref 22–32)
pCO2, Ven: 28.7 mm[Hg] — ABNORMAL LOW (ref 44–60)
pH, Ven: 7.296 (ref 7.25–7.43)
pO2, Ven: 43 mm[Hg] (ref 32–45)

## 2023-07-10 LAB — CBC
HCT: 42.5 % (ref 36.0–46.0)
Hemoglobin: 14 g/dL (ref 12.0–15.0)
MCH: 32.8 pg (ref 26.0–34.0)
MCHC: 32.9 g/dL (ref 30.0–36.0)
MCV: 99.5 fL (ref 80.0–100.0)
Platelets: 252 10*3/uL (ref 150–400)
RBC: 4.27 MIL/uL (ref 3.87–5.11)
RDW: 11.9 % (ref 11.5–15.5)
WBC: 10.9 10*3/uL — ABNORMAL HIGH (ref 4.0–10.5)
nRBC: 0 % (ref 0.0–0.2)

## 2023-07-10 LAB — COMPREHENSIVE METABOLIC PANEL
ALT: 14 U/L (ref 0–44)
AST: 14 U/L — ABNORMAL LOW (ref 15–41)
Albumin: 5 g/dL (ref 3.5–5.0)
Alkaline Phosphatase: 49 U/L (ref 38–126)
Anion gap: 23 — ABNORMAL HIGH (ref 5–15)
BUN: 18 mg/dL (ref 8–23)
CO2: 11 mmol/L — ABNORMAL LOW (ref 22–32)
Calcium: 10.9 mg/dL — ABNORMAL HIGH (ref 8.9–10.3)
Chloride: 99 mmol/L (ref 98–111)
Creatinine, Ser: 0.99 mg/dL (ref 0.44–1.00)
GFR, Estimated: 59 mL/min — ABNORMAL LOW (ref >60)
Glucose, Bld: 410 mg/dL — ABNORMAL HIGH (ref 70–99)
Potassium: 4.3 mmol/L (ref 3.5–5.1)
Sodium: 133 mmol/L — ABNORMAL LOW (ref 135–145)
Total Bilirubin: 0.6 mg/dL (ref <1.2)
Total Protein: 8.4 g/dL — ABNORMAL HIGH (ref 6.5–8.1)

## 2023-07-10 LAB — URINALYSIS, ROUTINE W REFLEX MICROSCOPIC
Bacteria, UA: NONE SEEN
Bilirubin Urine: NEGATIVE
Glucose, UA: >1000 mg/dL — AB
Hgb urine dipstick: NEGATIVE
Ketones, ur: >80 mg/dL — AB
Leukocytes,Ua: NEGATIVE
Nitrite: NEGATIVE
Protein, ur: NEGATIVE mg/dL
Specific Gravity, Urine: 1.025 (ref 1.005–1.030)
pH: 5 (ref 5.0–8.0)

## 2023-07-10 LAB — BASIC METABOLIC PANEL
Anion gap: 13 (ref 5–15)
Anion gap: 8 (ref 5–15)
BUN: 18 mg/dL (ref 8–23)
BUN: 16 mg/dL (ref 8–23)
CO2: 17 mmol/L — ABNORMAL LOW (ref 22–32)
CO2: 18 mmol/L — ABNORMAL LOW (ref 22–32)
Calcium: 9.6 mg/dL (ref 8.9–10.3)
Calcium: 9.2 mg/dL (ref 8.9–10.3)
Chloride: 106 mmol/L (ref 98–111)
Chloride: 108 mmol/L (ref 98–111)
Creatinine, Ser: 0.85 mg/dL (ref 0.44–1.00)
Creatinine, Ser: 0.72 mg/dL (ref 0.44–1.00)
GFR, Estimated: >60 mL/min (ref >60)
GFR, Estimated: >60 mL/min (ref >60)
Glucose, Bld: 250 mg/dL — ABNORMAL HIGH (ref 70–99)
Glucose, Bld: 238 mg/dL — ABNORMAL HIGH (ref 70–99)
Potassium: 4 mmol/L (ref 3.5–5.1)
Potassium: 3.6 mmol/L (ref 3.5–5.1)
Sodium: 136 mmol/L (ref 135–145)
Sodium: 134 mmol/L — ABNORMAL LOW (ref 135–145)

## 2023-07-10 LAB — CBG MONITORING, ED
Glucose-Capillary: 325 mg/dL — ABNORMAL HIGH (ref 70–99)
Glucose-Capillary: 306 mg/dL — ABNORMAL HIGH (ref 70–99)
Glucose-Capillary: 252 mg/dL — ABNORMAL HIGH (ref 70–99)

## 2023-07-10 LAB — LIPASE, BLOOD: Lipase: <10 U/L — ABNORMAL LOW (ref 11–51)

## 2023-07-10 LAB — BETA-HYDROXYBUTYRIC ACID
Beta-Hydroxybutyric Acid: 6.25 mmol/L — ABNORMAL HIGH (ref 0.05–0.27)
Beta-Hydroxybutyric Acid: 1.69 mmol/L — ABNORMAL HIGH (ref 0.05–0.27)

## 2023-07-10 LAB — GLUCOSE, CAPILLARY
Glucose-Capillary: 221 mg/dL — ABNORMAL HIGH (ref 70–99)
Glucose-Capillary: 230 mg/dL — ABNORMAL HIGH (ref 70–99)
Glucose-Capillary: 253 mg/dL — ABNORMAL HIGH (ref 70–99)

## 2023-07-10 LAB — MRSA NEXT GEN BY PCR, NASAL: MRSA by PCR Next Gen: NOT DETECTED

## 2023-07-10 MED ORDER — INSULIN REGULAR(HUMAN) IN NACL 100-0.9 UT/100ML-% IV SOLN
INTRAVENOUS | Status: DC
  Administered 2023-07-10: 6.5 [IU]/h via INTRAVENOUS
  Filled 2023-07-10: qty 100

## 2023-07-10 MED ORDER — CHLORHEXIDINE GLUCONATE CLOTH 2 % EX PADS
6.0000 | MEDICATED_PAD | Freq: Every day | CUTANEOUS | Status: DC
  Administered 2023-07-10 – 2023-07-12 (×3): 6 via TOPICAL

## 2023-07-10 MED ORDER — SENNOSIDES-DOCUSATE SODIUM 8.6-50 MG PO TABS: 1.0000 | ORAL_TABLET | Freq: Every evening | ORAL | Status: DC | PRN

## 2023-07-10 MED ORDER — ANASTROZOLE 1 MG PO TABS
1.0000 mg | ORAL_TABLET | Freq: Every day | ORAL | Status: DC
  Filled 2023-07-10 (×2): qty 1

## 2023-07-10 MED ORDER — INFLUENZA VAC A&B SURF ANT ADJ 0.5 ML IM SUSY
0.5000 mL | PREFILLED_SYRINGE | INTRAMUSCULAR | Status: DC
  Filled 2023-07-10: qty 0.5

## 2023-07-10 MED ORDER — SODIUM CHLORIDE 0.9 % IV BOLUS
1000.0000 mL | Freq: Once | INTRAVENOUS | Status: AC
  Administered 2023-07-10: 1000 mL via INTRAVENOUS

## 2023-07-10 MED ORDER — ORAL CARE MOUTH RINSE: 15.0000 mL | OROMUCOSAL | Status: DC | PRN

## 2023-07-10 MED ORDER — ONDANSETRON HCL 4 MG/2ML IJ SOLN
4.0000 mg | Freq: Once | INTRAMUSCULAR | Status: AC
  Administered 2023-07-10: 4 mg via INTRAVENOUS
  Filled 2023-07-10: qty 2

## 2023-07-10 MED ORDER — ROSUVASTATIN CALCIUM 10 MG PO TABS
5.0000 mg | ORAL_TABLET | ORAL | Status: DC
  Administered 2023-07-13: 5 mg via ORAL
  Filled 2023-07-10: qty 1

## 2023-07-10 MED ORDER — DEXTROSE IN LACTATED RINGERS 5 % IV SOLN: INTRAVENOUS | Status: DC

## 2023-07-10 MED ORDER — LACTATED RINGERS IV BOLUS
20.0000 mL/kg | Freq: Once | INTRAVENOUS | Status: AC
  Administered 2023-07-10: 1000 mL via INTRAVENOUS

## 2023-07-10 MED ORDER — ALPRAZOLAM 0.5 MG PO TABS
0.5000 mg | ORAL_TABLET | Freq: Once | ORAL | Status: AC
  Administered 2023-07-10: 0.5 mg via ORAL
  Filled 2023-07-10: qty 1

## 2023-07-10 MED ORDER — LEVOTHYROXINE SODIUM 75 MCG PO TABS
75.0000 ug | ORAL_TABLET | Freq: Every morning | ORAL | Status: DC
  Administered 2023-07-11 – 2023-07-13 (×3): 75 ug via ORAL
  Filled 2023-07-10 (×3): qty 1

## 2023-07-10 MED ORDER — SERTRALINE HCL 100 MG PO TABS: 200.0000 mg | ORAL_TABLET | Freq: Every day | ORAL | Status: DC

## 2023-07-10 MED ORDER — ONDANSETRON HCL 4 MG/2ML IJ SOLN
4.0000 mg | Freq: Four times a day (QID) | INTRAMUSCULAR | Status: DC | PRN
  Administered 2023-07-10: 4 mg via INTRAVENOUS
  Filled 2023-07-10: qty 2

## 2023-07-10 MED ORDER — ACETAMINOPHEN 650 MG RE SUPP: 650.0000 mg | Freq: Four times a day (QID) | RECTAL | Status: DC | PRN

## 2023-07-10 MED ORDER — LACTATED RINGERS IV SOLN
INTRAVENOUS | Status: DC
Start: 2023-07-10 — End: 2023-07-11

## 2023-07-10 MED ORDER — ENOXAPARIN SODIUM 40 MG/0.4ML IJ SOSY
40.0000 mg | PREFILLED_SYRINGE | INTRAMUSCULAR | Status: DC
  Administered 2023-07-10 – 2023-07-12 (×3): 40 mg via SUBCUTANEOUS
  Filled 2023-07-10 (×3): qty 0.4

## 2023-07-10 MED ORDER — ONDANSETRON 4 MG PO TBDP
4.0000 mg | ORAL_TABLET | Freq: Once | ORAL | Status: AC | PRN
  Administered 2023-07-10: 4 mg via ORAL
  Filled 2023-07-10: qty 1

## 2023-07-10 MED ORDER — ACETAMINOPHEN 325 MG PO TABS
650.0000 mg | ORAL_TABLET | Freq: Four times a day (QID) | ORAL | Status: DC | PRN
  Administered 2023-07-12: 650 mg via ORAL
  Filled 2023-07-10: qty 2

## 2023-07-10 MED ORDER — SERTRALINE HCL 100 MG PO TABS
150.0000 mg | ORAL_TABLET | Freq: Every day | ORAL | Status: DC
  Administered 2023-07-11 – 2023-07-13 (×3): 150 mg via ORAL
  Filled 2023-07-10 (×3): qty 2

## 2023-07-10 MED ORDER — ADULT MULTIVITAMIN W/MINERALS CH
1.0000 | ORAL_TABLET | Freq: Every day | ORAL | Status: DC
  Administered 2023-07-11 – 2023-07-12 (×2): 1 via ORAL
  Filled 2023-07-10 (×3): qty 1

## 2023-07-10 MED ORDER — PNEUMOCOCCAL 20-VAL CONJ VACC 0.5 ML IM SUSY
0.5000 mL | PREFILLED_SYRINGE | INTRAMUSCULAR | Status: DC
  Filled 2023-07-10: qty 0.5

## 2023-07-10 MED ORDER — POTASSIUM CHLORIDE 10 MEQ/100ML IV SOLN
10.0000 meq | INTRAVENOUS | Status: AC
  Administered 2023-07-10: 10 meq via INTRAVENOUS
  Filled 2023-07-10 (×2): qty 100

## 2023-07-10 MED ORDER — ONDANSETRON HCL 4 MG PO TABS
4.0000 mg | ORAL_TABLET | Freq: Four times a day (QID) | ORAL | Status: DC | PRN
  Administered 2023-07-11: 4 mg via ORAL
  Filled 2023-07-10: qty 1

## 2023-07-10 MED ORDER — DEXTROSE 50 % IV SOLN
0.0000 mL | INTRAVENOUS | Status: DC | PRN
  Administered 2023-07-11: 50 mL via INTRAVENOUS
  Filled 2023-07-10: qty 50

## 2023-07-10 MED ORDER — CLONAZEPAM 0.5 MG PO TABS
0.5000 mg | ORAL_TABLET | Freq: Every day | ORAL | Status: DC | PRN
  Administered 2023-07-10 – 2023-07-12 (×3): 0.5 mg via ORAL
  Filled 2023-07-10 (×3): qty 1

## 2023-07-10 NOTE — Plan of Care (Signed)
°  Problem: Education: Goal: Knowledge of General Education information will improve Description: Including pain rating scale, medication(s)/side effects and non-pharmacologic comfort measures Outcome: Progressing   Problem: Health Behavior/Discharge Planning: Goal: Ability to manage health-related needs will improve Outcome: Progressing   Problem: Clinical Measurements: Goal: Ability to maintain clinical measurements within normal limits will improve Outcome: Progressing Goal: Will remain free from infection Outcome: Progressing Goal: Diagnostic test results will improve Outcome: Progressing Goal: Respiratory complications will improve Outcome: Progressing Goal: Cardiovascular complication will be avoided Outcome: Progressing   Problem: Activity: Goal: Risk for activity intolerance will decrease Outcome: Progressing   Problem: Nutrition: Goal: Adequate nutrition will be maintained Outcome: Progressing   Problem: Nutrition: Goal: Adequate nutrition will be maintained Outcome: Progressing

## 2023-07-10 NOTE — ED Triage Notes (Signed)
Pt reports colonoscopy yesterday around 2p, reports "vomiting a lot all day yesterday, today too." Requesting IV hydration, "something for nausea & a prescription for that."

## 2023-07-10 NOTE — ED Notes (Signed)
[facility]ed [facility] for transport 

## 2023-07-10 NOTE — ED Notes (Addendum)
RT Note:  VBG completed, Physician aware

## 2023-07-10 NOTE — ED Provider Notes (Signed)
[place] EMERGENCY DEPARTMENT AT [facility] Provider Note   CSN: [id number] Arrival date & time: 07/10/23  1316     History  Chief Complaint  Patient presents with   Emesis   Nausea    Patty Lopez is a 77 y.o. female.  Is a 77 year old female present emergency department with nausea vomiting.  She is type I diabetic, has been preparing for colonoscopy which she had yesterday.  Decreased p.o. intake, has been using less insulin as she did not want to go hypoglycemic.  After procedure she began having some nausea and vomiting and has not been able to tolerate p.o. since last night.  No abdominal pain.  No chest pain no shortness of breath.  No fevers.   Emesis      Home Medications Prior to Admission medications   Medication Sig Start Date End Date Taking? Authorizing Provider  anastrozole (ARIMIDEX) 1 MG tablet Take 1 tablet (1 mg total) by mouth daily. 06/02/23  Yes Serena Croissant, MD  Calcium Carb-Cholecalciferol (CALCIUM + D3 PO) Take 1 tablet by mouth every evening.   Yes [provider]  clonazePAM (KLONOPIN) 1 MG tablet Take 0.5-1 mg by mouth See admin instructions. Take 0.5 mg by mouth at bedtime and an additional 1 mg once a day as needed for anxiety or sleep 01/10/20  Yes [provider]  Continuous Glucose Sensor (FREESTYLE LIBRE 2 SENSOR) MISC Inject 1 Device into the skin every 14 (fourteen) days.   Yes [provider]  Insulin Aspart (NOVOLOG FLEXPEN RELION [place]) Inject 2-7 Units into the skin See admin instructions. Inject 2 units into the skin in the morning and 5-7 units three times a day with meals   Yes [provider]  LANTUS SOLOSTAR 100 UNIT/ML Solostar Pen Inject 8 Units into the skin at bedtime. 01/07/20  Yes [provider]  levothyroxine (SYNTHROID) 75 MCG tablet Take 75 mcg by mouth daily before breakfast. 04/15/21  Yes [provider]  Multiple Vitamin (MULTIVITAMIN WITH MINERALS) TABS tablet  Take 1 tablet by mouth every evening.   Yes [provider]  rosuvastatin (CRESTOR) 5 MG tablet Take 5 mg by mouth 3 (three) times a week.   Yes [provider]  sertraline (ZOLOFT) 100 MG tablet Take 150 mg by mouth daily. 01/31/20  Yes [provider]  TYLENOL 500 MG tablet Take 500-1,000 mg by mouth every 6 (six) hours as needed for mild pain (pain score 1-3) or headache (or back pain).   Yes [provider]  Continuous Blood Gluc Receiver (FREESTYLE LIBRE 14 DAY READER) DEVI See admin instructions. 01/10/20   [provider]      Allergies    Patient has no known allergies.    Review of Systems   Review of Systems  Gastrointestinal:  Positive for vomiting.    Physical Exam Updated Vital Signs BP (!) 120/49 (BP Location: Right Arm)   Pulse 87   Temp 99.8 F (37.7 C) (Oral)   Resp (!) 24   Ht 5\' 4"  (1.626 m)   Wt 54.5 kg   SpO2 98%   BMI 20.62 kg/m  Physical Exam Vitals and nursing note reviewed.  Constitutional:      General: She is not in acute distress. HENT:     Head: Normocephalic.     Nose: Nose normal.     Mouth/Throat:     Mouth: Mucous membranes are moist.  Eyes:     Conjunctiva/sclera: Conjunctivae normal.  Cardiovascular:     Rate and Rhythm: Normal rate and regular rhythm.  Pulmonary:     Effort: Pulmonary effort is normal.  Abdominal:     General: Abdomen is flat. There is no distension.     Tenderness: There is no abdominal tenderness. There is no guarding or rebound.  Musculoskeletal:        General: Normal range of motion.  Skin:    General: Skin is warm and dry.     Capillary Refill: Capillary refill takes less than 2 seconds.  Neurological:     Mental Status: She is alert and oriented to person, place, and time.  Psychiatric:        Mood and Affect: Mood normal.        Behavior: Behavior normal.     ED Results / Procedures / Treatments   Labs (all labs ordered are listed, but only abnormal  results are displayed) Labs Reviewed  LIPASE, BLOOD - Abnormal; Notable for the following components:      Result Value   Lipase <10 (*)    All other components within normal limits  COMPREHENSIVE METABOLIC PANEL - Abnormal; Notable for the following components:   Sodium 133 (*)    CO2 11 (*)    Glucose, Bld 410 (*)    Calcium 10.9 (*)    Total Protein 8.4 (*)    AST 14 (*)    GFR, Estimated 59 (*)    Anion gap 23 (*)    All other components within normal limits  CBC - Abnormal; Notable for the following components:   WBC 10.9 (*)    All other components within normal limits  URINALYSIS, ROUTINE W REFLEX MICROSCOPIC - Abnormal; Notable for the following components:   Color, Urine COLORLESS (*)    Glucose, UA >1,000 (*)    Ketones, ur >80 (*)    All other components within normal limits  BETA-HYDROXYBUTYRIC ACID - Abnormal; Notable for the following components:   Beta-Hydroxybutyric Acid 6.25 (*)    All other components within normal limits  BASIC METABOLIC PANEL - Abnormal; Notable for the following components:   CO2 17 (*)    Glucose, Bld 250 (*)    All other components within normal limits  GLUCOSE, CAPILLARY - Abnormal; Notable for the following components:   Glucose-Capillary 221 (*)    All other components within normal limits  GLUCOSE, CAPILLARY - Abnormal; Notable for the following components:   Glucose-Capillary 253 (*)    All other components within normal limits  CBG MONITORING, ED - Abnormal; Notable for the following components:   Glucose-Capillary 325 (*)    All other components within normal limits  I-STAT VENOUS BLOOD GAS, ED - Abnormal; Notable for the following components:   pCO2, Ven 28.7 (*)    Bicarbonate 14.0 (*)    TCO2 15 (*)    Acid-base deficit 11.0 (*)    Sodium 133 (*)    All other components within normal limits  CBG MONITORING, ED - Abnormal; Notable for the following components:   Glucose-Capillary 306 (*)    All other components within  normal limits  CBG MONITORING, ED - Abnormal; Notable for the following components:   Glucose-Capillary 252 (*)    All other components within normal limits  MRSA NEXT GEN BY PCR, NASAL  BASIC METABOLIC PANEL  BASIC METABOLIC PANEL  BETA-HYDROXYBUTYRIC ACID  TSH  CBC    EKG EKG Interpretation Date/Time:  Friday July 10 2023 15:46:45 EST Ventricular Rate:  85 PR Interval:  115 QRS Duration:  90 QT Interval:  420 QTC Calculation: 500 R Axis:   49  Text Interpretation: Sinus rhythm Borderline short PR interval Consider right atrial enlargement Minimal ST depression, anterolateral leads Borderline prolonged QT interval Confirmed by Estanislado Pandy [id number]) on 07/10/2023 3:59:33 PM  Radiology No results found.  Procedures .Critical Care  Performed by: Coral Spikes, DO Authorized by: Coral Spikes, DO   Critical care provider statement:    Critical care time (minutes):  30   Critical care was necessary to treat or prevent imminent or life-threatening deterioration of the following conditions:  Endocrine crisis   Critical care was time spent personally by me on the following activities:  Development of treatment plan with patient or surrogate, discussions with consultants, evaluation of patient's response to treatment, examination of patient, ordering and review of laboratory studies, ordering and review of radiographic studies, ordering and performing treatments and interventions, pulse oximetry, re-evaluation of patient's condition and review of old charts     Medications Ordered in ED Medications  insulin regular, human (MYXREDLIN) 100 units/ 100 mL infusion ( Intravenous Infusion Verify 07/10/23 2200)  lactated ringers infusion ( Intravenous Not Given 07/10/23 2003)  dextrose 5 % in lactated ringers infusion ( Intravenous Infusion Verify 07/10/23 2200)  dextrose 50 % solution 0-50 mL (has no administration in time range)  potassium chloride 10 mEq in 100 mL IVPB (0 mEq  Intravenous Stopped 07/10/23 1751)  Chlorhexidine Gluconate Cloth 2 % PADS 6 each (6 each Topical Given 07/10/23 1900)  Oral care mouth rinse (has no administration in time range)  enoxaparin (LOVENOX) injection 40 mg (40 mg Subcutaneous Given 07/10/23 2144)  acetaminophen (TYLENOL) tablet 650 mg (has no administration in time range)    Or  acetaminophen (TYLENOL) suppository 650 mg (has no administration in time range)  senna-docusate (Senokot-S) tablet 1 tablet (has no administration in time range)  ondansetron (ZOFRAN) tablet 4 mg ( Oral See Alternative 07/10/23 2046)    Or  ondansetron (ZOFRAN) injection 4 mg (4 mg Intravenous Given 07/10/23 2046)  levothyroxine (SYNTHROID) tablet 75 mcg (has no administration in time range)  rosuvastatin (CRESTOR) tablet 5 mg (5 mg Oral Patient Refused/Not Given 07/10/23 2114)  clonazePAM (KLONOPIN) tablet 0.5 mg (0.5 mg Oral Given 07/10/23 2214)  anastrozole (ARIMIDEX) tablet 1 mg (1 mg Oral Patient Refused/Not Given 07/10/23 2115)  multivitamin with minerals tablet 1 tablet (has no administration in time range)  sertraline (ZOLOFT) tablet 150 mg (150 mg Oral Patient Refused/Not Given 07/10/23 2115)  pneumococcal 20-valent conjugate vaccine (PREVNAR 20) injection 0.5 mL (has no administration in time range)  influenza vaccine adjuvanted (FLUAD) injection 0.5 mL (has no administration in time range)  ondansetron (ZOFRAN-ODT) disintegrating tablet 4 mg (4 mg Oral Given 07/10/23 1335)  sodium chloride 0.9 % bolus 1,000 mL ( Intravenous Stopped 07/10/23 1711)  lactated ringers bolus 1,124 mL (1,000 mLs Intravenous New Bag/Given 07/10/23 1703)  ondansetron (ZOFRAN) injection 4 mg (4 mg Intravenous Given 07/10/23 1659)  ALPRAZolam (XANAX) tablet 0.5 mg (0.5 mg Oral Given 07/10/23 1805)    ED Course/ Medical Decision Making/ A&P Clinical Course as of 07/10/23 2243  Fri Jul 10, 2023  1517 Comprehensive metabolic panel(!) Anion gap noted with a low bicarb.  She is  hyperglycemic with history of diabetes.  Will give IV fluids, insulin and check VBG/beta hydroxybutyrate for possible DKA. [TY]  1518 Pre chart review from endocrine : "Continue Lantus 8 units ONCE daily. Please  give at the same time every day.   Novolog  2-3 units in the morning   4 units <150 5 units 151-180 6 units >180 7 units if having extra carbs   Please call or message if BG is consistently less than 70 or greater than 300  " [TY]    Clinical Course User Index [TY] Coral Spikes, DO                                 Medical Decision Making This 77 year old female with history of diabetes.  Present emergency department for nausea vomiting.  Has soft benign abdomen.  She is alert and oriented x 3 with no localizing deficits.  Labs consistent with early DKA as noted in ED course.  Mild leukocytosis likely reactionary.  No transaminitis to suggest hepatobiliary disease.  Lipase not elevated.  Pancreatitis unlikely.  She has a soft benign abdomen on exam.  Low suspicion for acute intra-abdominal pathology.  I suspect her symptoms are secondary to decreased insulin usage in preparation for her colonoscopy.  Amount and/or Complexity of Data Reviewed Labs: ordered. Decision-making details documented in ED Course. ECG/medicine tests: ordered.  Risk Prescription drug management. Decision regarding hospitalization.           Final Clinical Impression(s) / ED Diagnoses Final diagnoses:  Diabetic ketoacidosis without coma associated with type 1 diabetes mellitus [facility])    Rx / DC Orders ED Discharge Orders     None         Coral Spikes, DO 07/10/23 2243

## 2023-07-10 NOTE — ED Notes (Addendum)
CBG reading 306

## 2023-07-10 NOTE — Progress Notes (Signed)
Plan of Care Note for accepted transfer   Patient: Patty Lopez MRN: [id number]   DOA: 07/10/2023  Facility requesting transfer: [facility] ED Requesting Provider: Dr Estanislado Pandy Reason for transfer: Possible DKA and type I DM Facility course:  Patient with a history of type I DM, hypothyroidism, depression, presented with nausea and vomiting x 1 day after days of preparing for colonoscopy which she had on 11/7.  While she was prepping for colonoscopy, patient had decreased p.o. intake and has been using less insulin to prevent hypoglycemic events.  In the ED, patient noted to be tachycardic, otherwise vital signs stable.  Labs showed bicarb of 11, anion gap of 23, with glucose in the 400s, lipase negative, LFTs WNL.  Patient unable to keep anything down due to nausea and vomiting.  Patient was started on insulin drip, IV fluids.  Requesting transfer for further management.  Plan of care: The patient is accepted for admission to [facility] unit, at [facility]..    Author: Briant Cedar, MD 07/10/2023  Check [email or web address] for on-call coverage.  Nursing staff, Please call [facility] Admits & Consults System-Wide number on Amion as soon as patient's arrival, so appropriate admitting provider can evaluate the pt.

## 2023-07-10 NOTE — H&P (Signed)
History and Physical    Patient: Patty Lopez ZOX:[id number] DOB: 07-03-1946 DOA: 07/10/2023 DOS: the patient was seen and examined on 07/10/2023 PCP: Farris Has, MD  Patient coming from:  [facility]  Chief Complaint:  Chief Complaint  Patient presents with   Emesis   Nausea   HPI: Patty Lopez is a 77 y.o. female with medical history significant of type 1 diabetes, hypothyroidism, anxiety and depression, and breast cancer s/p mastectomy who presents to [facility] ED for evaluation of nausea and vomiting. Patient reports that she recently completed 2 days of bowel prep for a colonoscopy on Thursday. She had decreased p.o. intake and used less of her insulin to prevent hypoglycemic episodes during her bowel prep. She reports multiple emesis this morning and after her colonoscopy yesterday afternoon.  She denies any blood in her vomit.  She denies any abdominal pain, shortness of breath, dizziness, headaches or vision changes.  [facility] ED course:  Tachycardic to 105 but otherwise stable vitals. Labs show lipase <10, sodium 133, K+ 4.3, bicarb 11, glucose 14, calcium 10.9, anion gap 23, WBC 10.9, Hgb 14.0, BHB 6.25, UA with significant glucosuria and ketonuria.  Patient was given IV fluid bolus and started on insulin drip. [facility] was [facility] and transferred to [facility].  Review of Systems: As mentioned in the history of present illness. All other systems reviewed and are negative. Past Medical History:  Diagnosis Date   Anxiety    Cancer (HCC)    right breast cancer 2002, left breast cancer 12/2019   Depression    Diabetes mellitus without complication (HCC) 2021   Type I   Hypothyroidism    Personal history of chemotherapy    Personal history of radiation therapy    PONV (postoperative nausea and vomiting)    Past Surgical History:  Procedure Laterality Date   ABDOMINAL HYSTERECTOMY     Partial   AUGMENTATION MAMMAPLASTY Right    BREAST  LUMPECTOMY Right 11/23/2000   EYE SURGERY  2021   cataract   MASTECTOMY W/ SENTINEL NODE BIOPSY Left 03/08/2020   Procedure: LEFT BREAST SIMPLE MASTECTOMY WITH LEFT SENTINEL LYMPH NODE MAPPING;  Surgeon: Harriette Bouillon, MD;  Location: [facility] OR;  Service: General;  Laterality: Left;  PEC BLOCK   REDUCTION MAMMAPLASTY Left    Social History:  reports that she has quit smoking. She has quit using smokeless tobacco. She reports current alcohol use of about 7.0 standard drinks of alcohol per week. She reports that she does not use drugs.  No Known Allergies  No family history on file.  Prior to Admission medications   Medication Sig Start Date End Date Taking? Authorizing Provider  ADACEL 12-31-13.5 LF-MCG/0.5 injection  04/01/23   [provider]  anastrozole (ARIMIDEX) 1 MG tablet Take 1 tablet (1 mg total) by mouth daily. 06/02/23   Serena Croissant, MD  Calcium Carb-Cholecalciferol (CALCIUM + D3 PO) Take 1 tablet by mouth every evening.    [provider]  clonazePAM (KLONOPIN) 1 MG tablet Take 0.5-1 mg by mouth daily as needed for anxiety. 01/10/20   [provider]  Continuous Blood Gluc Receiver (FREESTYLE LIBRE 14 DAY READER) DEVI See admin instructions. 01/10/20   [provider]  insulin aspart (NOVOLOG) 100 UNIT/ML injection Inject 1 Units into the skin 3 (three) times daily before meals.    [provider]  LANTUS SOLOSTAR 100 UNIT/ML Solostar Pen Inject 6 Units into the skin at bedtime. Takes 6 units  01/07/20   [provider]  levothyroxine (SYNTHROID) 75 MCG tablet Take 75 mcg by mouth every morning. 04/15/21   [provider]  Multiple Vitamin (MULTIVITAMIN WITH MINERALS) TABS tablet Take 1 tablet by mouth every evening.    [provider]  rosuvastatin (CRESTOR) 5 MG tablet Take 5 mg by mouth 3 (three) times a week.    [provider]  sertraline (ZOLOFT) 100 MG tablet Take 200 mg by mouth daily. 01/31/20   [provider]    Physical Exam: Vitals:   07/10/23 1500 07/10/23 1545 07/10/23 1715 07/10/23 1849  BP:  (!) 129/56 (!) 131/53   Pulse:  86 92   Resp:  15 16   Temp:  98.7 F (37.1 C)    SpO2:  100% 100%   Weight: 56.2 kg   54.5 kg  Height:    5\' 4"  (1.626 m)   General: Pleasant, well-appearing elderly woman laying in bed. No acute distress. HEENT: [place]/AT.  Dry mucous membrane.  CV: RRR. No murmurs, rubs, or gallops. No LE edema Pulmonary: Lungs CTAB. Normal effort. No wheezing or rales. Abdominal: Soft, nontender, nondistended. Normal bowel sounds. Extremities: Palpable radial and DP pulses. Normal ROM. Skin: Warm and dry.  Decreased skin turgor Neuro: A&Ox3. Moves all extremities. Normal sensation. No focal deficit. Psych: Normal mood and affect  Data Reviewed:  Labs show lipase <10, sodium 133, K+ 4.3, bicarb 11, glucose 410, calcium 10.9, anion gap 23, WBC 10.9, Hgb 14.0, BHB 6.25, UA with significant glucosuria and ketonuria.  EKG showed sinus rhythm with nonspecific ST changes and slightly prolonged QTc  Assessment and Plan: Patty Lopez is a 77 y.o. female with medical history significant of type 1 diabetes, hypothyroidism, anxiety and depression, and breast cancer who presents to [facility] for evaluation of N/V and admitted for DKA.  # Diabetic ketoacidosis Type I diabetic patient with recent colonoscopy prep leading to severe dehydration, nausea and vomiting found to have evidence of diabetic ketoacidosis with BS in the 400s, bicarb 11, AG 23, BHB 6.25, glucosuria and ketonuria. K+ 4.3 on initial CMP. She was started on insulin drip and transferred to [facility]. Patient dry on exam but hemodynamically stable and at baseline mental status. -Admit to stepdown unit --Continue insulin drip --LR @ 125 mL/hr until CBG less than 250 --Switch to D5-LR when 1 CBG less than 250 --Keep NPO  --BMP Q4H --CBG Q1H --F/u BHB -- Once anion gap closed 2, start CM diet and if  able to eat, administer Lantus 6 units -- Continue insulin drip for 1-2 more hours, then discontinue and start SSI-S  -- DC fluids if eating, drinking, and off insulin drip  # Type 1 diabetes Patient reports she was diagnosed 3 years ago and did not have history of diabetes prior to the diagnosis. Last A1c 6.8% 1 month ago. Follows endocrinology at [facility]. Last appointment on 10/08, they recommended patient takes 8 units of Lantus daily and administer NovoLog 2-3 units in the morning and 4-7 units with meals. -Will need adjustment to insulin regimen prior to discharge -Follow-up with endocrinology in the outpatient  # Hypothyroidism -Check TSH -Levothyroxine 75 mcg daily with breakfast  # Anxiety and depression -Sertraline 200 mg daily -Clonazepam 0.5 mg daily as needed for anxiety  # History of breast cancer Status post left mastectomy -Anastrozole 1 mg daily  # HLD -Crestor 5 mg 3 times weekly (MWF)   Advance Care Planning:   Code Status: Full  Code   Consults: None  Family Communication: Discussed admission with spouse at bedside  Severity of Illness: The appropriate patient status for this patient is INPATIENT. Inpatient status is judged to be reasonable and necessary in order to provide the required intensity of service to ensure the patient's safety. The patient's presenting symptoms, physical exam findings, and initial radiographic and laboratory data in the context of their chronic comorbidities is felt to place them at high risk for further clinical deterioration. Furthermore, it is not anticipated that the patient will be medically stable for discharge from the hospital within 2 midnights of admission.   * I certify that at the point of admission it is my clinical judgment that the patient will require inpatient hospital care spanning beyond 2 midnights from the point of admission due to high intensity of service, high risk for further deterioration and high frequency of  surveillance required.*  Author: Steffanie Rainwater, MD 07/10/2023 7:45 PM  For on call review [email or web address].

## 2023-07-11 DIAGNOSIS — E1010 Type 1 diabetes mellitus with ketoacidosis without coma: Secondary | ICD-10-CM | POA: Diagnosis not present

## 2023-07-11 LAB — BASIC METABOLIC PANEL
Anion gap: 10 (ref 5–15)
Anion gap: 10 (ref 5–15)
Anion gap: 6 (ref 5–15)
BUN: 14 mg/dL (ref 8–23)
BUN: 13 mg/dL (ref 8–23)
BUN: 12 mg/dL (ref 8–23)
CO2: 20 mmol/L — ABNORMAL LOW (ref 22–32)
CO2: 21 mmol/L — ABNORMAL LOW (ref 22–32)
CO2: 24 mmol/L (ref 22–32)
Calcium: 9.4 mg/dL (ref 8.9–10.3)
Calcium: 9.5 mg/dL (ref 8.9–10.3)
Calcium: 9.1 mg/dL (ref 8.9–10.3)
Chloride: 107 mmol/L (ref 98–111)
Chloride: 107 mmol/L (ref 98–111)
Chloride: 105 mmol/L (ref 98–111)
Creatinine, Ser: 0.58 mg/dL (ref 0.44–1.00)
Creatinine, Ser: 0.63 mg/dL (ref 0.44–1.00)
Creatinine, Ser: 0.6 mg/dL (ref 0.44–1.00)
GFR, Estimated: >60 mL/min (ref >60)
GFR, Estimated: >60 mL/min (ref >60)
GFR, Estimated: >60 mL/min (ref >60)
Glucose, Bld: 221 mg/dL — ABNORMAL HIGH (ref 70–99)
Glucose, Bld: 215 mg/dL — ABNORMAL HIGH (ref 70–99)
Glucose, Bld: 236 mg/dL — ABNORMAL HIGH (ref 70–99)
Potassium: 3.6 mmol/L (ref 3.5–5.1)
Potassium: 3.4 mmol/L — ABNORMAL LOW (ref 3.5–5.1)
Potassium: 3.3 mmol/L — ABNORMAL LOW (ref 3.5–5.1)
Sodium: 137 mmol/L (ref 135–145)
Sodium: 138 mmol/L (ref 135–145)
Sodium: 135 mmol/L (ref 135–145)

## 2023-07-11 LAB — GLUCOSE, CAPILLARY
Glucose-Capillary: 117 mg/dL — ABNORMAL HIGH (ref 70–99)
Glucose-Capillary: 127 mg/dL — ABNORMAL HIGH (ref 70–99)
Glucose-Capillary: 170 mg/dL — ABNORMAL HIGH (ref 70–99)
Glucose-Capillary: 178 mg/dL — ABNORMAL HIGH (ref 70–99)
Glucose-Capillary: 178 mg/dL — ABNORMAL HIGH (ref 70–99)
Glucose-Capillary: 184 mg/dL — ABNORMAL HIGH (ref 70–99)
Glucose-Capillary: 188 mg/dL — ABNORMAL HIGH (ref 70–99)
Glucose-Capillary: 194 mg/dL — ABNORMAL HIGH (ref 70–99)
Glucose-Capillary: 197 mg/dL — ABNORMAL HIGH (ref 70–99)
Glucose-Capillary: 205 mg/dL — ABNORMAL HIGH (ref 70–99)
Glucose-Capillary: 206 mg/dL — ABNORMAL HIGH (ref 70–99)
Glucose-Capillary: 209 mg/dL — ABNORMAL HIGH (ref 70–99)
Glucose-Capillary: 210 mg/dL — ABNORMAL HIGH (ref 70–99)
Glucose-Capillary: 216 mg/dL — ABNORMAL HIGH (ref 70–99)
Glucose-Capillary: 216 mg/dL — ABNORMAL HIGH (ref 70–99)
Glucose-Capillary: 218 mg/dL — ABNORMAL HIGH (ref 70–99)
Glucose-Capillary: 225 mg/dL — ABNORMAL HIGH (ref 70–99)
Glucose-Capillary: 228 mg/dL — ABNORMAL HIGH (ref 70–99)
Glucose-Capillary: 240 mg/dL — ABNORMAL HIGH (ref 70–99)

## 2023-07-11 LAB — CBC
HCT: 33.4 % — ABNORMAL LOW (ref 36.0–46.0)
Hemoglobin: 11.3 g/dL — ABNORMAL LOW (ref 12.0–15.0)
MCH: 34.5 pg — ABNORMAL HIGH (ref 26.0–34.0)
MCHC: 33.8 g/dL (ref 30.0–36.0)
MCV: 101.8 fL — ABNORMAL HIGH (ref 80.0–100.0)
Platelets: 189 10*3/uL (ref 150–400)
RBC: 3.28 MIL/uL — ABNORMAL LOW (ref 3.87–5.11)
RDW: 11.9 % (ref 11.5–15.5)
WBC: 8 10*3/uL (ref 4.0–10.5)
nRBC: 0 % (ref 0.0–0.2)

## 2023-07-11 LAB — BETA-HYDROXYBUTYRIC ACID
Beta-Hydroxybutyric Acid: 1.13 mmol/L — ABNORMAL HIGH (ref 0.05–0.27)
Beta-Hydroxybutyric Acid: 0.5 mmol/L — ABNORMAL HIGH (ref 0.05–0.27)

## 2023-07-11 LAB — TSH: TSH: 0.702 u[IU]/mL (ref 0.350–4.500)

## 2023-07-11 MED ORDER — ALUM & MAG HYDROXIDE-SIMETH 200-200-20 MG/5ML PO SUSP: 30.0000 mL | ORAL | Status: DC | PRN

## 2023-07-11 MED ORDER — INSULIN ASPART 100 UNIT/ML IJ SOLN
0.0000 [IU] | Freq: Three times a day (TID) | INTRAMUSCULAR | Status: DC
  Administered 2023-07-11: 1 [IU] via SUBCUTANEOUS
  Administered 2023-07-12: 5 [IU] via SUBCUTANEOUS
  Administered 2023-07-12: 7 [IU] via SUBCUTANEOUS
  Administered 2023-07-12: 9 [IU] via SUBCUTANEOUS
  Administered 2023-07-13: 3 [IU] via SUBCUTANEOUS
  Administered 2023-07-13: 5 [IU] via SUBCUTANEOUS

## 2023-07-11 MED ORDER — INSULIN DETEMIR 100 UNIT/ML ~~LOC~~ SOLN
10.0000 [IU] | SUBCUTANEOUS | Status: DC
  Administered 2023-07-11: 10 [IU] via SUBCUTANEOUS
  Filled 2023-07-11 (×2): qty 0.1

## 2023-07-11 MED ORDER — ANASTROZOLE 1 MG PO TABS
1.0000 mg | ORAL_TABLET | Freq: Every day | ORAL | Status: DC
  Administered 2023-07-11 – 2023-07-13 (×3): 1 mg via ORAL
  Filled 2023-07-11 (×4): qty 1

## 2023-07-11 MED ORDER — INSULIN ASPART 100 UNIT/ML IJ SOLN: 0.0000 [IU] | Freq: Every day | INTRAMUSCULAR | Status: DC

## 2023-07-11 MED ORDER — PROMETHAZINE HCL 25 MG PO TABS
25.0000 mg | ORAL_TABLET | Freq: Four times a day (QID) | ORAL | Status: DC | PRN
  Administered 2023-07-11 – 2023-07-12 (×2): 25 mg via ORAL
  Filled 2023-07-11 (×2): qty 1

## 2023-07-11 MED ORDER — POTASSIUM CHLORIDE CRYS ER 20 MEQ PO TBCR
40.0000 meq | EXTENDED_RELEASE_TABLET | Freq: Two times a day (BID) | ORAL | Status: AC
  Administered 2023-07-11 – 2023-07-12 (×4): 40 meq via ORAL
  Filled 2023-07-11 (×4): qty 2

## 2023-07-11 NOTE — Progress Notes (Signed)
PROGRESS NOTE    Patty Lopez  ZOX:[id number] DOB: 08-20-1946 DOA: 07/10/2023 PCP: Farris Has, MD    Brief Narrative:  77 year old with late onset type 1 diabetes on insulin, hypothyroidism, anxiety depression, breast cancer chronically on anastrozole underwent colonoscopy 2 days ago and since then she is having nausea and vomiting.  Patient took half dose of insulin and last few days she was not taking any insulin because she was not eating.  In the emergency room hemodynamically stable.  Bicarb 11, anion gap 23.  Significant glucosuria and ketonuria.  Started on IV fluid boluses, insulin drip and admitted to the hospital.  Subjective: Patient seen and examined.  She has some nausea but denies any abdominal pain.  She has not have any bowel movement after colonoscopy.  Remains afebrile.  Has not tried any food overnight.  Zofran is not working and she is looking for some other medications.  She thinks she cannot take oral medications.  Will start patient on Phenergan. Anion gap has closed, however patient is still nauseated and unable to eat and she needs to stay on insulin infusion until she is able to take oral diet.  Assessment & Plan:   Diabetic ketoacidosis in a patient with insulin-dependent diabetes: Presented with blood sugar more than 400, bicarb 11, beta hydroxybutyrate 6.25.  Potassium is adequate.  Currently on insulin infusion with adequate blood sugar control.  Patient is still has nausea and unable to eat.  Patient is insulin-dependent, will continue on insulin infusion until patient is able to eat at least 50% of her meals. Continue insulin drip, continue maintenance fluid either isotonic fluid or D5 depends upon her blood sugars. Known A1c 6.8 about a month ago.  Patient is on Lantus 8 units daily at home along with prandial insulin.  Chronic medical issues including Hypothyroidism, continue Synthroid Anxiety pression, on sertraline and clonazepam that she takes in the  morning. Breast cancer, on maintenance anastrozole.  Continue. Hyperlipidemia, Crestor.  Continue.   DVT prophylaxis: enoxaparin (LOVENOX) injection 40 mg Start: 07/10/23 2200   Code Status: Full code Family Communication: None at bedside Disposition Plan: Status is: Inpatient Remains inpatient appropriate because: On insulin infusion, high risk medication management including insulin drip.  Remains in the stepdown unit.     Consultants:  None  Procedures:  None  Antimicrobials:  None     Objective: Vitals:   07/11/23 0530 07/11/23 0600 07/11/23 0700 07/11/23 0848  BP:   (!) 133/54   Pulse:   78   Resp: (!) 21 20 (!) 22   Temp:    98.3 F (36.8 C)  TempSrc:    Oral  SpO2: 95% 95% (!) 88%   Weight:      Height:        Intake/Output Summary (Last 24 hours) at 07/11/2023 1124 Last data filed at 07/11/2023 0700 Gross per 24 hour  Intake 3502.66 ml  Output --  Net 3502.66 ml   Filed Weights   07/10/23 1500 07/10/23 1849  Weight: 56.2 kg 54.5 kg    Examination:  General exam: Appears calm and comfortable  Respiratory system: Clear to auscultation. Respiratory effort normal. Cardiovascular system: S1 & S2 heard, RRR.  No edema. Gastrointestinal system: Soft and nontender.  Bowel sound present. Central nervous system: Alert and oriented. No focal neurological deficits. Extremities: Symmetric 5 x 5 power. Skin: No rashes, lesions or ulcers Psychiatry: Judgement and insight appear normal. Mood & affect appropriate.     Data Reviewed: I  have personally reviewed following labs and imaging studies  CBC: Recent Labs  Lab 07/10/23 1336 07/10/23 1621 07/11/23 0336  WBC 10.9*  --  8.0  HGB 14.0 13.9 11.3*  HCT 42.5 41.0 33.4*  MCV 99.5  --  101.8*  PLT 252  --  189   Basic Metabolic Panel: Recent Labs  Lab 07/10/23 1336 07/10/23 1621 07/10/23 1924 07/10/23 2322 07/11/23 0336 07/11/23 0740  NA 133* 133* 136 134* 137 138  K 4.3 4.9 4.0 3.6 3.6 3.4*   CL 99  --  106 108 107 107  CO2 11*  --  17* 18* 20* 21*  GLUCOSE 410*  --  250* 238* 221* 215*  BUN 18  --  18 16 14 13   CREATININE 0.99  --  0.85 0.72 0.58 0.63  CALCIUM 10.9*  --  9.6 9.2 9.4 9.5   GFR: Estimated Creatinine Clearance: 51.5 mL/min (by C-G formula based on SCr of 0.63 mg/dL). Liver Function Tests: Recent Labs  Lab 07/10/23 1336  AST 14*  ALT 14  ALKPHOS 49  BILITOT 0.6  PROT 8.4*  ALBUMIN 5.0   Recent Labs  Lab 07/10/23 1336  LIPASE <10*   No results for input(s): "AMMONIA" in the last 168 hours. Coagulation Profile: No results for input(s): "INR", "PROTIME" in the last 168 hours. Cardiac Enzymes: No results for input(s): "CKTOTAL", "CKMB", "CKMBINDEX", "TROPONINI" in the last 168 hours. BNP (last 3 results) No results for input(s): "PROBNP" in the last 8760 hours. HbA1C: No results for input(s): "HGBA1C" in the last 72 hours. CBG: Recent Labs  Lab 07/11/23 0545 07/11/23 0645 07/11/23 0814 07/11/23 0915 07/11/23 1021  GLUCAP 228* 194* 209* 178* 206*   Lipid Profile: No results for input(s): "CHOL", "HDL", "LDLCALC", "TRIG", "CHOLHDL", "LDLDIRECT" in the last 72 hours. Thyroid Function Tests: Recent Labs    07/10/23 2322  TSH 0.702   Anemia Panel: No results for input(s): "VITAMINB12", "FOLATE", "FERRITIN", "TIBC", "IRON", "RETICCTPCT" in the last 72 hours. Sepsis Labs: No results for input(s): "PROCALCITON", "LATICACIDVEN" in the last 168 hours.  Recent Results (from the past 240 hour(s))  MRSA Next Gen by PCR, Nasal     Status: None   Collection Time: 07/10/23  6:56 PM   Specimen: Nasal Mucosa; Nasal Swab  Result Value Ref Range Status   MRSA by PCR Next Gen NOT DETECTED NOT DETECTED Final    Comment: (NOTE) The GeneXpert MRSA Assay (FDA approved for NASAL specimens only), is one component of a comprehensive MRSA colonization surveillance program. It is not intended to diagnose MRSA infection nor to guide or monitor treatment for  MRSA infections. Test performance is not FDA approved in patients less than 29 years old. Performed at [facility], [address], [place], [place] [address]          Radiology Studies: No results found.      Scheduled Meds:  anastrozole  1 mg Oral Daily   Chlorhexidine Gluconate Cloth  6 each Topical Daily   enoxaparin (LOVENOX) injection  40 mg Subcutaneous Q24H   influenza vaccine adjuvanted  0.5 mL Intramuscular Tomorrow-1000   levothyroxine  75 mcg Oral q morning   multivitamin with minerals  1 tablet Oral Daily   pneumococcal 20-valent conjugate vaccine  0.5 mL Intramuscular Tomorrow-1000   rosuvastatin  5 mg Oral Once per day on Monday Wednesday Friday   sertraline  150 mg Oral Daily   Continuous Infusions:  dextrose 5% lactated ringers 125 mL/hr at  07/11/23 0700   insulin 1.6 mL/hr at 07/11/23 0700   lactated ringers       LOS: 1 day    Time spent: 50 minutes    Dorcas Carrow, MD [facility]

## 2023-07-11 NOTE — Plan of Care (Signed)

## 2023-07-12 ENCOUNTER — Encounter (HOSPITAL_COMMUNITY): Payer: Self-pay | Admitting: Student

## 2023-07-12 DIAGNOSIS — E0810 Diabetes mellitus due to underlying condition with ketoacidosis without coma: Secondary | ICD-10-CM | POA: Diagnosis not present

## 2023-07-12 LAB — MAGNESIUM: Magnesium: 1.6 mg/dL — ABNORMAL LOW (ref 1.7–2.4)

## 2023-07-12 LAB — CBC WITH DIFFERENTIAL/PLATELET
Abs Immature Granulocytes: 0.03 10*3/uL (ref 0.00–0.07)
Basophils Absolute: 0 10*3/uL (ref 0.0–0.1)
Basophils Relative: 0 %
Eosinophils Absolute: 0 10*3/uL (ref 0.0–0.5)
Eosinophils Relative: 0 %
HCT: 34.7 % — ABNORMAL LOW (ref 36.0–46.0)
Hemoglobin: 11.3 g/dL — ABNORMAL LOW (ref 12.0–15.0)
Immature Granulocytes: 0 %
Lymphocytes Relative: 12 %
Lymphs Abs: 1.1 10*3/uL (ref 0.7–4.0)
MCH: 33.1 pg (ref 26.0–34.0)
MCHC: 32.6 g/dL (ref 30.0–36.0)
MCV: 101.8 fL — ABNORMAL HIGH (ref 80.0–100.0)
Monocytes Absolute: 1.1 10*3/uL — ABNORMAL HIGH (ref 0.1–1.0)
Monocytes Relative: 12 %
Neutro Abs: 7 10*3/uL (ref 1.7–7.7)
Neutrophils Relative %: 76 %
Platelets: 147 10*3/uL — ABNORMAL LOW (ref 150–400)
RBC: 3.41 MIL/uL — ABNORMAL LOW (ref 3.87–5.11)
RDW: 11.8 % (ref 11.5–15.5)
WBC: 9.3 10*3/uL (ref 4.0–10.5)
nRBC: 0 % (ref 0.0–0.2)

## 2023-07-12 LAB — GLUCOSE, CAPILLARY
Glucose-Capillary: 31 mg/dL — CL (ref 70–99)
Glucose-Capillary: 319 mg/dL — ABNORMAL HIGH (ref 70–99)
Glucose-Capillary: 265 mg/dL — ABNORMAL HIGH (ref 70–99)
Glucose-Capillary: 357 mg/dL — ABNORMAL HIGH (ref 70–99)
Glucose-Capillary: 141 mg/dL — ABNORMAL HIGH (ref 70–99)

## 2023-07-12 LAB — COMPREHENSIVE METABOLIC PANEL
ALT: 16 U/L (ref 0–44)
AST: 16 U/L (ref 15–41)
Albumin: 3.3 g/dL — ABNORMAL LOW (ref 3.5–5.0)
Alkaline Phosphatase: 36 U/L — ABNORMAL LOW (ref 38–126)
Anion gap: 8 (ref 5–15)
BUN: 10 mg/dL (ref 8–23)
CO2: 24 mmol/L (ref 22–32)
Calcium: 8.7 mg/dL — ABNORMAL LOW (ref 8.9–10.3)
Chloride: 103 mmol/L (ref 98–111)
Creatinine, Ser: 0.41 mg/dL — ABNORMAL LOW (ref 0.44–1.00)
GFR, Estimated: >60 mL/min (ref >60)
Glucose, Bld: 161 mg/dL — ABNORMAL HIGH (ref 70–99)
Potassium: 3.2 mmol/L — ABNORMAL LOW (ref 3.5–5.1)
Sodium: 135 mmol/L (ref 135–145)
Total Bilirubin: 0.9 mg/dL (ref <1.2)
Total Protein: 6 g/dL — ABNORMAL LOW (ref 6.5–8.1)

## 2023-07-12 LAB — PHOSPHORUS: Phosphorus: 1.6 mg/dL — ABNORMAL LOW (ref 2.5–4.6)

## 2023-07-12 MED ORDER — DEXTROSE 5 % IV SOLN
30.0000 mmol | Freq: Once | INTRAVENOUS | Status: AC
  Administered 2023-07-12: 30 mmol via INTRAVENOUS
  Filled 2023-07-12: qty 10

## 2023-07-12 MED ORDER — INSULIN DETEMIR 100 UNIT/ML ~~LOC~~ SOLN
5.0000 [IU] | SUBCUTANEOUS | Status: DC
  Administered 2023-07-12: 5 [IU] via SUBCUTANEOUS
  Filled 2023-07-12 (×2): qty 0.05

## 2023-07-12 MED ORDER — MAGNESIUM SULFATE 2 GM/50ML IV SOLN
2.0000 g | Freq: Once | INTRAVENOUS | Status: AC
  Administered 2023-07-12: 2 g via INTRAVENOUS
  Filled 2023-07-12: qty 50

## 2023-07-12 MED ORDER — METOCLOPRAMIDE HCL 5 MG PO TABS
10.0000 mg | ORAL_TABLET | Freq: Three times a day (TID) | ORAL | Status: AC
  Administered 2023-07-12 – 2023-07-13 (×4): 10 mg via ORAL
  Filled 2023-07-12 (×5): qty 2

## 2023-07-12 NOTE — Progress Notes (Signed)
PROGRESS NOTE    Patty Lopez  ZOX:[id number] DOB: 1946-07-02 DOA: 07/10/2023 PCP: Farris Has, MD    Brief Narrative:  77 year old with late onset type 1 diabetes on insulin, hypothyroidism, anxiety depression, breast cancer chronically on anastrozole underwent colonoscopy 2 days ago and since then she is having nausea and vomiting.  Patient took half dose of insulin and last few days she was not taking any insulin because she was not eating.  In the emergency room hemodynamically stable.  Bicarb 11, anion gap 23.  Significant glucosuria and ketonuria.  Started on IV fluid boluses, insulin drip and admitted to the hospital. Remains in the hospital.  Persistently nauseated.  Hypoglycemic episodes.  Subjective: Patient seen and examined.  Still has intermittent nausea and not able to eat well.  Blood sugars are controlled, however she had an episode of hypoglycemia overnight and she was unaware of that.  Feels restricted with all the IV lines, telemetry monitors and oxygen sensors. Will start patient on scheduled Reglan to help improve her nausea so she can eat and possibly go home tomorrow.  Assessment & Plan:   Diabetic ketoacidosis in a patient with insulin-dependent diabetes: Presented with blood sugar more than 400, bicarb 11, beta hydroxybutyrate 6.25.  Patient was treated with IV insulin and subsequently now on subcu insulin.  He still does not have good oral intake.  Reported hypoglycemic episode due to poor oral intake.  Will decrease dose of Lantus to 5 units today.  Keep on sliding scale insulin.  Encourage oral intake.  Encourage meal and fluid intake.  Known A1c 6.8 about a month ago.  Patient is on Lantus 8 units daily at home along with prandial insulin. Reglan 10 mg before meals.  Hypokalemia Hypophosphatemia Hypomagnesemia Replace aggressively and monitor levels.  Chronic medical issues including Hypothyroidism, continue Synthroid Anxiety pression, on sertraline and  clonazepam that she takes in the morning. Breast cancer, on maintenance anastrozole.  Continue. Hyperlipidemia, Crestor.  Continue.   DVT prophylaxis: enoxaparin (LOVENOX) injection 40 mg Start: 07/10/23 2200   Code Status: Full code Family Communication: None at bedside Disposition Plan: Status is: Inpatient.  She can transfer to MedSurg bed. Remains inpatient appropriate because: Blood sugar control.  Persistently nauseated.     Consultants:  None  Procedures:  None  Antimicrobials:  None     Objective: Vitals:   07/12/23 0500 07/12/23 0600 07/12/23 0700 07/12/23 0851  BP: (!) 148/63 (!) 147/63 133/67   Pulse: 72 75 71   Resp: 16 (!) 21 (!) 22   Temp:    98 F (36.7 C)  TempSrc:    Oral  SpO2: 100% 96% 97%   Weight:      Height:        Intake/Output Summary (Last 24 hours) at 07/12/2023 1034 Last data filed at 07/11/2023 1800 Gross per 24 hour  Intake 608.08 ml  Output --  Net 608.08 ml   Filed Weights   07/10/23 1500 07/10/23 1849  Weight: 56.2 kg 54.5 kg    Examination:  General exam: Appears comfortable but slightly anxious. Respiratory system: Clear to auscultation. Respiratory effort normal. Cardiovascular system: S1 & S2 heard, RRR.  No edema. Gastrointestinal system: Soft and nontender.  Bowel sound present. Central nervous system: Alert and oriented. No focal neurological deficits. Extremities: Symmetric 5 x 5 power. Skin: No rashes, lesions or ulcers Psychiatry: Judgement and insight appear normal. Mood & affect appropriate.     Data Reviewed: I have personally reviewed following labs  and imaging studies  CBC: Recent Labs  Lab 07/10/23 1336 07/10/23 1621 07/11/23 0336 07/12/23 0259  WBC 10.9*  --  8.0 9.3  NEUTROABS  --   --   --  7.0  HGB 14.0 13.9 11.3* 11.3*  HCT 42.5 41.0 33.4* 34.7*  MCV 99.5  --  101.8* 101.8*  PLT 252  --  189 147*   Basic Metabolic Panel: Recent Labs  Lab 07/10/23 2322 07/11/23 0336 07/11/23 0740  07/11/23 1333 07/12/23 0259  NA 134* 137 138 135 135  K 3.6 3.6 3.4* 3.3* 3.2*  CL 108 107 107 105 103  CO2 18* 20* 21* 24 24  GLUCOSE 238* 221* 215* 236* 161*  BUN 16 14 13 12 10   CREATININE 0.72 0.58 0.63 0.60 0.41*  CALCIUM 9.2 9.4 9.5 9.1 8.7*  MG  --   --   --   --  1.6*  PHOS  --   --   --   --  1.6*   GFR: Estimated Creatinine Clearance: 51.5 mL/min (A) (by C-G formula based on SCr of 0.41 mg/dL (L)). Liver Function Tests: Recent Labs  Lab 07/10/23 1336 07/12/23 0259  AST 14* 16  ALT 14 16  ALKPHOS 49 36*  BILITOT 0.6 0.9  PROT 8.4* 6.0*  ALBUMIN 5.0 3.3*   Recent Labs  Lab 07/10/23 1336  LIPASE <10*   No results for input(s): "AMMONIA" in the last 168 hours. Coagulation Profile: No results for input(s): "INR", "PROTIME" in the last 168 hours. Cardiac Enzymes: No results for input(s): "CKTOTAL", "CKMB", "CKMBINDEX", "TROPONINI" in the last 168 hours. BNP (last 3 results) No results for input(s): "PROBNP" in the last 8760 hours. HbA1C: No results for input(s): "HGBA1C" in the last 72 hours. CBG: Recent Labs  Lab 07/11/23 1532 07/11/23 1637 07/11/23 1717 07/11/23 2254 07/12/23 0838  GLUCAP 197* 170* 127* 117* 319*   Lipid Profile: No results for input(s): "CHOL", "HDL", "LDLCALC", "TRIG", "CHOLHDL", "LDLDIRECT" in the last 72 hours. Thyroid Function Tests: Recent Labs    07/10/23 2322  TSH 0.702   Anemia Panel: No results for input(s): "VITAMINB12", "FOLATE", "FERRITIN", "TIBC", "IRON", "RETICCTPCT" in the last 72 hours. Sepsis Labs: No results for input(s): "PROCALCITON", "LATICACIDVEN" in the last 168 hours.  Recent Results (from the past 240 hour(s))  MRSA Next Gen by PCR, Nasal     Status: None   Collection Time: 07/10/23  6:56 PM   Specimen: Nasal Mucosa; Nasal Swab  Result Value Ref Range Status   MRSA by PCR Next Gen NOT DETECTED NOT DETECTED Final    Comment: (NOTE) The GeneXpert MRSA Assay (FDA approved for NASAL specimens  only), is one component of a comprehensive MRSA colonization surveillance program. It is not intended to diagnose MRSA infection nor to guide or monitor treatment for MRSA infections. Test performance is not FDA approved in patients less than 32 years old. Performed at [facility], [address] [place], [place] [address]          Radiology Studies: No results found.      Scheduled Meds:  anastrozole  1 mg Oral Daily   Chlorhexidine Gluconate Cloth  6 each Topical Daily   enoxaparin (LOVENOX) injection  40 mg Subcutaneous Q24H   influenza vaccine adjuvanted  0.5 mL Intramuscular Tomorrow-1000   insulin aspart  0-5 Units Subcutaneous QHS   insulin aspart  0-9 Units Subcutaneous TID WC   insulin detemir  5 Units Subcutaneous Q24H   levothyroxine  75 mcg Oral q morning   metoCLOPramide  10 mg Oral TID AC & HS   multivitamin with minerals  1 tablet Oral Daily   pneumococcal 20-valent conjugate vaccine  0.5 mL Intramuscular Tomorrow-1000   potassium chloride  40 mEq Oral BID   rosuvastatin  5 mg Oral Once per day on Monday Wednesday Friday   sertraline  150 mg Oral Daily   Continuous Infusions:  potassium PHOSPHATE IVPB (in mmol)       LOS: 2 days    Time spent: 40 minutes    Dorcas Carrow, MD [facility]

## 2023-07-13 DIAGNOSIS — E1010 Type 1 diabetes mellitus with ketoacidosis without coma: Secondary | ICD-10-CM | POA: Diagnosis not present

## 2023-07-13 LAB — COMPREHENSIVE METABOLIC PANEL
ALT: 17 U/L (ref 0–44)
AST: 14 U/L — ABNORMAL LOW (ref 15–41)
Albumin: 3.5 g/dL (ref 3.5–5.0)
Alkaline Phosphatase: 45 U/L (ref 38–126)
Anion gap: 10 (ref 5–15)
BUN: 10 mg/dL (ref 8–23)
CO2: 24 mmol/L (ref 22–32)
Calcium: 8.6 mg/dL — ABNORMAL LOW (ref 8.9–10.3)
Chloride: 94 mmol/L — ABNORMAL LOW (ref 98–111)
Creatinine, Ser: 0.53 mg/dL (ref 0.44–1.00)
GFR, Estimated: >60 mL/min (ref >60)
Glucose, Bld: 121 mg/dL — ABNORMAL HIGH (ref 70–99)
Potassium: 3.8 mmol/L (ref 3.5–5.1)
Sodium: 128 mmol/L — ABNORMAL LOW (ref 135–145)
Total Bilirubin: 1.2 mg/dL — ABNORMAL HIGH (ref <1.2)
Total Protein: 6.2 g/dL — ABNORMAL LOW (ref 6.5–8.1)

## 2023-07-13 LAB — CBC WITH DIFFERENTIAL/PLATELET
Abs Immature Granulocytes: 0.08 10*3/uL — ABNORMAL HIGH (ref 0.00–0.07)
Basophils Absolute: 0 10*3/uL (ref 0.0–0.1)
Basophils Relative: 0 %
Eosinophils Absolute: 0 10*3/uL (ref 0.0–0.5)
Eosinophils Relative: 0 %
HCT: 37.5 % (ref 36.0–46.0)
Hemoglobin: 13 g/dL (ref 12.0–15.0)
Immature Granulocytes: 1 %
Lymphocytes Relative: 18 %
Lymphs Abs: 1.5 10*3/uL (ref 0.7–4.0)
MCH: 33.5 pg (ref 26.0–34.0)
MCHC: 34.7 g/dL (ref 30.0–36.0)
MCV: 96.6 fL (ref 80.0–100.0)
Monocytes Absolute: 1.1 10*3/uL — ABNORMAL HIGH (ref 0.1–1.0)
Monocytes Relative: 14 %
Neutro Abs: 5.3 10*3/uL (ref 1.7–7.7)
Neutrophils Relative %: 67 %
Platelets: 171 10*3/uL (ref 150–400)
RBC: 3.88 MIL/uL (ref 3.87–5.11)
RDW: 11.5 % (ref 11.5–15.5)
WBC: 7.9 10*3/uL (ref 4.0–10.5)
nRBC: 0 % (ref 0.0–0.2)

## 2023-07-13 LAB — GLUCOSE, CAPILLARY
Glucose-Capillary: 226 mg/dL — ABNORMAL HIGH (ref 70–99)
Glucose-Capillary: 289 mg/dL — ABNORMAL HIGH (ref 70–99)

## 2023-07-13 LAB — MAGNESIUM: Magnesium: 1.9 mg/dL (ref 1.7–2.4)

## 2023-07-13 LAB — PHOSPHORUS: Phosphorus: 2.3 mg/dL — ABNORMAL LOW (ref 2.5–4.6)

## 2023-07-13 MED ORDER — METOCLOPRAMIDE HCL 10 MG PO TABS: 10.0000 mg | ORAL_TABLET | Freq: Four times a day (QID) | ORAL | 0 refills | Status: AC | PRN

## 2023-07-13 MED ORDER — INSULIN DETEMIR 100 UNIT/ML ~~LOC~~ SOLN
6.0000 [IU] | Freq: Every day | SUBCUTANEOUS | Status: DC
  Administered 2023-07-13: 6 [IU] via SUBCUTANEOUS
  Filled 2023-07-13: qty 0.06

## 2023-07-13 MED ORDER — LIDOCAINE VISCOUS HCL 2 % MT SOLN
15.0000 mL | Freq: Once | OROMUCOSAL | Status: AC
  Administered 2023-07-13: 15 mL via ORAL
  Filled 2023-07-13: qty 15

## 2023-07-13 MED ORDER — POTASSIUM & SODIUM PHOSPHATES 280-160-250 MG PO PACK
1.0000 | PACK | Freq: Three times a day (TID) | ORAL | Status: DC
  Administered 2023-07-13: 1 via ORAL
  Filled 2023-07-13 (×2): qty 1

## 2023-07-13 MED ORDER — ALUM & MAG HYDROXIDE-SIMETH 200-200-20 MG/5ML PO SUSP: 30.0000 mL | Freq: Once | ORAL | Status: DC

## 2023-07-13 MED ORDER — LIDOCAINE VISCOUS HCL 2 % MT SOLN: 15.0000 mL | Freq: Three times a day (TID) | OROMUCOSAL | 0 refills | Status: AC | PRN

## 2023-07-13 MED ORDER — FAMOTIDINE 20 MG PO TABS: 20.0000 mg | ORAL_TABLET | Freq: Every day | ORAL | 0 refills | Status: AC

## 2023-07-13 NOTE — Plan of Care (Signed)
  Problem: Clinical Measurements: Goal: Respiratory complications will improve Outcome: Progressing   Problem: Activity: Goal: Risk for activity intolerance will decrease Outcome: Progressing   Problem: Elimination: Goal: Will not experience complications related to urinary retention Outcome: Progressing   Problem: Pain Management: Goal: General experience of comfort will improve Outcome: Progressing   Problem: Safety: Goal: Ability to remain free from injury will improve Outcome: Progressing   Problem: Skin Integrity: Goal: Risk for impaired skin integrity will decrease Outcome: Progressing   Problem: Nutrition: Goal: Adequate nutrition will be maintained Outcome: Not Progressing

## 2023-07-13 NOTE — Plan of Care (Signed)
  Problem: Pain Management: Goal: General experience of comfort will improve Outcome: Progressing   Problem: Elimination: Goal: Will not experience complications related to urinary retention Outcome: Progressing   Problem: Education: Goal: Knowledge of General Education information will improve Description: Including pain rating scale, medication(s)/side effects and non-pharmacologic comfort measures Outcome: Progressing

## 2023-07-13 NOTE — Discharge Summary (Signed)
Physician Discharge Summary  Patty Lopez:[id number] DOB: Dec 31, 1945 DOA: 07/10/2023  PCP: Farris Has, MD  Admit date: 07/10/2023 Discharge date: 07/13/2023  Admitted From: Home Disposition: Home  Recommendations for Outpatient Follow-up:  Follow up with PCP in 1-2 weeks Please obtain BMP/CBC/magnesium/phosphorus in one week   Home Health: N/A Equipment/Devices: N/A  Discharge Condition: Stable CODE STATUS: Full code Diet recommendation: Low-carb diet  Discharge summary: 77 year old with late onset type 1 diabetes on insulin, hypothyroidism, anxiety depression, breast cancer chronically on anastrozole underwent colonoscopy 2 days ago and since then she is having nausea and vomiting.  Patient took half dose of insulin and last few days she was not taking any insulin because she was not eating.  In the emergency room hemodynamically stable.  Bicarb 11, anion gap 23.  Significant glucosuria and ketonuria.  Started on IV fluid boluses, insulin drip and admitted to the hospital.  Patient continued to have nausea, unpredictable blood glucose levels so stayed in the hospital.  Clinically improving today.  Able to go home.   Assessment & plan of care:   Diabetic ketoacidosis in a patient with insulin-dependent diabetes: Presented with blood sugar more than 400, bicarb 11, beta hydroxybutyrate 6.25.  Patient was treated with IV insulin and subsequently now on subcu insulin. Patient also developed hypoglycemic episode with poor appetite.  Hemoglobin A1c 6.8.  Symptoms improved today.  She will receive 6 units of Lantus today before discharge.  She will resume her prandial insulin at home today, she will resume her insulin tomorrow.   Nausea and vomiting, persistent.  Much improved today.  Good response to metoclopramide.  QTc is 472.  Will prescribe a short course of Reglan to use.  Patient also had complained of epigastric discomfort mostly related to nausea.  Will use Pepcid 20 mg  daily.  She was also prescribed gastric cocktail to use as needed for short-term.   Hypokalemia Hypophosphatemia Hypomagnesemia Replaced aggressively.  Levels have improved.   Chronic medical issues including Hypothyroidism, continue Synthroid Anxiety pression, on sertraline and clonazepam that she takes in the morning. Breast cancer, on maintenance anastrozole.  Continue. Hyperlipidemia, Crestor.  Continue.  Medically stable to discharge today.     Discharge Diagnoses:  Principal Problem:   DKA (diabetic ketoacidosis) [facility])    Discharge Instructions  Discharge Instructions     Call MD for:  persistant nausea and vomiting   Complete by: As directed    Diet Carb Modified   Complete by: As directed    Increase activity slowly   Complete by: As directed       Allergies as of 07/13/2023   No Known Allergies      Medication List     TAKE these medications    anastrozole 1 MG tablet Commonly known as: ARIMIDEX Take 1 tablet (1 mg total) by mouth daily.   CALCIUM + D3 PO Take 1 tablet by mouth every evening.   clonazePAM 1 MG tablet Commonly known as: KLONOPIN Take 0.5-1 mg by mouth See admin instructions. Take 0.5 mg by mouth at bedtime and an additional 1 mg once a day as needed for anxiety or sleep   famotidine 20 MG tablet Commonly known as: PEPCID Take 1 tablet (20 mg total) by mouth daily.   FreeStyle Libre 14 Day Reader Hardie Pulley See admin instructions.   FreeStyle Libre 2 Sensor Misc Inject 1 Device into the skin every 14 (fourteen) days.   Lantus SoloStar 100 UNIT/ML Solostar Pen Generic drug: insulin glargine  Inject 8 Units into the skin at bedtime.   levothyroxine 75 MCG tablet Commonly known as: SYNTHROID Take 75 mcg by mouth daily before breakfast.   metoCLOPramide 10 MG tablet Commonly known as: REGLAN Take 1 tablet (10 mg total) by mouth every 6 (six) hours as needed for up to 7 days for nausea or vomiting.   multivitamin with minerals  Tabs tablet Take 1 tablet by mouth every evening.   NOVOLOG FLEXPEN RELION [place] Inject 2-7 Units into the skin See admin instructions. Inject 2 units into the skin in the morning and 5-7 units three times a day with meals   rosuvastatin 5 MG tablet Commonly known as: CRESTOR Take 5 mg by mouth 3 (three) times a week.   sertraline 100 MG tablet Commonly known as: ZOLOFT Take 150 mg by mouth daily.   TYLENOL 500 MG tablet Generic drug: acetaminophen Take 500-1,000 mg by mouth every 6 (six) hours as needed for mild pain (pain score 1-3) or headache (or back pain).        No Known Allergies  Consultations: None   Procedures/Studies: No results found. (Echo, Carotid, EGD, Colonoscopy, ERCP)    Subjective: Patient seen and examined.  Did not like the food here.  Eager to go home and be with her dog.  Nausea better with metoclopramide.  Blood sugars are fairly controlled over last 24 hours and slightly elevated.  Insulin dose was skipped yesterday because of hypoglycemic risk. Phosphorus is 2.3, will allow auto correction. Sodium is 128 and this is likely erroneous labs.  Will need recheck in about 1 week.   Discharge Exam: Vitals:   07/13/23 0718 07/13/23 0800  BP:  (!) 142/50  Pulse: 72 72  Resp: 17 (!) 24  Temp: 98.5 F (36.9 C)   SpO2: 96% 95%   Vitals:   07/13/23 0653 07/13/23 0700 07/13/23 0718 07/13/23 0800  BP: (!) 145/79   (!) 142/50  Pulse:  76 72 72  Resp:  20 17 (!) 24  Temp:   98.5 F (36.9 C)   TempSrc:   Oral   SpO2:  97% 96% 95%  Weight:      Height:        General: Pt is alert, awake, not in acute distress Cardiovascular: RRR, S1/S2 +, no rubs, no gallops Respiratory: CTA bilaterally, no wheezing, no rhonchi Abdominal: Soft, NT, ND, bowel sounds + Extremities: no edema, no cyanosis    The results of significant diagnostics from this hospitalization (including imaging, microbiology, ancillary and laboratory) are listed below for reference.      Microbiology: Recent Results (from the past 240 hour(s))  MRSA Next Gen by PCR, Nasal     Status: None   Collection Time: 07/10/23  6:56 PM   Specimen: Nasal Mucosa; Nasal Swab  Result Value Ref Range Status   MRSA by PCR Next Gen NOT DETECTED NOT DETECTED Final    Comment: (NOTE) The GeneXpert MRSA Assay (FDA approved for NASAL specimens only), is one component of a comprehensive MRSA colonization surveillance program. It is not intended to diagnose MRSA infection nor to guide or monitor treatment for MRSA infections. Test performance is not FDA approved in patients less than 79 years old. Performed at [facility], [address], [place], [place] [address]      Labs: BNP (last 3 results) No results for input(s): "BNP" in the last 8760 hours. Basic Metabolic Panel: Recent Labs  Lab 07/11/23 0336 07/11/23 0740 07/11/23 1333 07/12/23  0259 07/13/23 0309  NA 137 138 135 135 128*  K 3.6 3.4* 3.3* 3.2* 3.8  CL 107 107 105 103 94*  CO2 20* 21* 24 24 24   GLUCOSE 221* 215* 236* 161* 121*  BUN 14 13 12 10 10   CREATININE 0.58 0.63 0.60 0.41* 0.53  CALCIUM 9.4 9.5 9.1 8.7* 8.6*  MG  --   --   --  1.6* 1.9  PHOS  --   --   --  1.6* 2.3*   Liver Function Tests: Recent Labs  Lab 07/10/23 1336 07/12/23 0259 07/13/23 0309  AST 14* 16 14*  ALT 14 16 17   ALKPHOS 49 36* 45  BILITOT 0.6 0.9 1.2*  PROT 8.4* 6.0* 6.2*  ALBUMIN 5.0 3.3* 3.5   Recent Labs  Lab 07/10/23 1336  LIPASE <10*   No results for input(s): "AMMONIA" in the last 168 hours. CBC: Recent Labs  Lab 07/10/23 1336 07/10/23 1621 07/11/23 0336 07/12/23 0259 07/13/23 0309  WBC 10.9*  --  8.0 9.3 7.9  NEUTROABS  --   --   --  7.0 5.3  HGB 14.0 13.9 11.3* 11.3* 13.0  HCT 42.5 41.0 33.4* 34.7* 37.5  MCV 99.5  --  101.8* 101.8* 96.6  PLT 252  --  189 147* 171   Cardiac Enzymes: No results for input(s): "CKTOTAL", "CKMB", "CKMBINDEX", "TROPONINI" in the last 168  hours. BNP: Invalid input(s): "POCBNP" CBG: Recent Labs  Lab 07/12/23 0838 07/12/23 1126 07/12/23 1615 07/12/23 2155 07/13/23 0727  GLUCAP 319* 265* 357* 141* 226*   D-Dimer No results for input(s): "DDIMER" in the last 72 hours. Hgb A1c No results for input(s): "HGBA1C" in the last 72 hours. Lipid Profile No results for input(s): "CHOL", "HDL", "LDLCALC", "TRIG", "CHOLHDL", "LDLDIRECT" in the last 72 hours. Thyroid function studies Recent Labs    07/10/23 2322  TSH 0.702   Anemia work up No results for input(s): "VITAMINB12", "FOLATE", "FERRITIN", "TIBC", "IRON", "RETICCTPCT" in the last 72 hours. Urinalysis    Component Value Date/Time   COLORURINE COLORLESS (A) 07/10/2023 1559   APPEARANCEUR CLEAR 07/10/2023 1559   LABSPEC 1.025 07/10/2023 1559   PHURINE 5.0 07/10/2023 1559   GLUCOSEU >1,000 (A) 07/10/2023 1559   HGBUR NEGATIVE 07/10/2023 1559   BILIRUBINUR NEGATIVE 07/10/2023 1559   KETONESUR >80 (A) 07/10/2023 1559   PROTEINUR NEGATIVE 07/10/2023 1559   NITRITE NEGATIVE 07/10/2023 1559   LEUKOCYTESUR NEGATIVE 07/10/2023 1559   Sepsis Labs Recent Labs  Lab 07/10/23 1336 07/11/23 0336 07/12/23 0259 07/13/23 0309  WBC 10.9* 8.0 9.3 7.9   Microbiology Recent Results (from the past 240 hour(s))  MRSA Next Gen by PCR, Nasal     Status: None   Collection Time: 07/10/23  6:56 PM   Specimen: Nasal Mucosa; Nasal Swab  Result Value Ref Range Status   MRSA by PCR Next Gen NOT DETECTED NOT DETECTED Final    Comment: (NOTE) The GeneXpert MRSA Assay (FDA approved for NASAL specimens only), is one component of a comprehensive MRSA colonization surveillance program. It is not intended to diagnose MRSA infection nor to guide or monitor treatment for MRSA infections. Test performance is not FDA approved in patients less than 75 years old. Performed at [facility], [address], [place], [place] [address]      Time coordinating discharge:  35 minutes  SIGNED:   Dorcas Carrow, MD  [facility] 07/13/2023, 9:49 AM

## 2023-07-13 NOTE — TOC Initial Note (Signed)
Transition of Care [facility]) - Initial/Assessment Note    Patient Details  Name: Patty Lopez MRN: [id number] Date of Birth: [birth year]  Transition [facility]) CM/SW Contact:    Adrian Prows, RN Phone Number: 07/13/2023, 10:11 AM  Clinical Narrative:                 TOC for d/c planning' spoke w/ pt in room; pt says she lives at home w/ her spouse Kijuana Poitra [phone]) and she plans to return at d/c; she has transportation; pt denies SDOH risks; pt has reading glasses; pt says she does not have DME, HH services, or home oxygen; no TOC needs.  Expected Discharge Plan: Home/Self Care Barriers to Discharge: No Barriers Identified   Patient Goals and CMS Choice Patient states their goals for this hospitalization and ongoing recovery are:: home          Expected Discharge Plan and Services   Discharge Planning Services: CM Consult Post Acute Care Choice: NA Living arrangements for the past 2 months: Single Family Home Expected Discharge Date: 07/13/23               DME Arranged: N/A DME Agency: NA       HH Arranged: NA HH Agency: NA        Prior Living Arrangements/Services Living arrangements for the past 2 months: Single Family Home Lives with:: Spouse Patient language and need for interpreter reviewed:: Yes Do you feel safe going back to the place where you live?: Yes      Need for Family Participation in Patient Care: Yes (Comment) Care giver support system in place?: Yes (comment) Current home services:  (n/a) Criminal Activity/Legal Involvement Pertinent to Current Situation/Hospitalization: No - Comment as needed  Activities of Daily Living   ADL Screening (condition at time of admission) Independently performs ADLs?: Yes (appropriate for developmental age) Is the patient deaf or have difficulty hearing?: No Does the patient have difficulty seeing, even when wearing glasses/contacts?: No Does the patient have difficulty concentrating,  remembering, or making decisions?: No  Permission Sought/Granted Permission sought to share information with : Case Manager Permission granted to share information with : Yes, Verbal Permission Granted  Share Information with NAME: Case Manager     Permission granted to share info w Relationship: Chance Mathieu (spouse) [phone]     Emotional Assessment Appearance:: Appears stated age Attitude/Demeanor/Rapport: Gracious Affect (typically observed): Accepting Orientation: : Oriented to Self, Oriented to Place, Oriented to  Time, Oriented to Situation Alcohol / Substance Use: Not Applicable Psych Involvement: No (comment)  Admission diagnosis:  DKA (diabetic ketoacidosis) (HCC) [E11.10] Diabetic ketoacidosis without coma associated with type 1 diabetes mellitus (HCC) [E10.10] Patient Active Problem List   Diagnosis Date Noted   DKA (diabetic ketoacidosis) (HCC) 07/10/2023   Breast cancer, stage 2, left (HCC) 03/08/2020   Newly diagnosed diabetes (HCC) 02/17/2020   Malignant neoplasm of upper-outer quadrant of left breast in female, estrogen receptor positive (HCC) 02/06/2020   PCP:  Farris Has, MD Pharmacy:   [facility] [address] [place] - [address] [place] [address] Phone: [phone] Fax: [phone]     Social Determinants of Health (SDOH) Social History: SDOH Screenings   Food Insecurity: No Food Insecurity (07/13/2023)  Housing: Low Risk  (07/13/2023)  Recent Concern: Housing - High Risk (07/10/2023)  Transportation Needs: No Transportation Needs (07/13/2023)  Utilities: Not At Risk (07/13/2023)  Depression (PHQ2-9): Low Risk  (02/17/2020)  Tobacco Use: Medium  Risk (07/09/2023)   Received from [facility]   SDOH Interventions: Food Insecurity Interventions: Intervention Not Indicated, Inpatient TOC Housing Interventions: Intervention Not Indicated, Inpatient TOC Transportation Interventions:  Intervention Not Indicated, Inpatient TOC Utilities Interventions: Intervention Not Indicated, Inpatient TOC   Readmission Risk Interventions    07/13/2023   10:09 AM  Readmission Risk Prevention Plan  Transportation Screening Complete  PCP or Specialist Appt within 5-7 Days Complete  Home Care Screening Complete  Medication Review (RN CM) Complete

## 2023-07-13 NOTE — Plan of Care (Signed)
  Problem: Education: Goal: Knowledge of General Education information will improve Description: Including pain rating scale, medication(s)/side effects and non-pharmacologic comfort measures 07/13/2023 1152 by Waldron Labs, RN Outcome: Adequate for Discharge 07/13/2023 0906 by Waldron Labs, RN Outcome: Progressing

## 2023-07-13 NOTE — Progress Notes (Signed)
Discharge instructions/packet reviewed with patient and husband Joe at bedside. Verbalized understanding, no questions. Pt taken to personal vehicle by wheelchair.

## 2023-07-16 DIAGNOSIS — Z09 Encounter for follow-up examination after completed treatment for conditions other than malignant neoplasm: Secondary | ICD-10-CM | POA: Diagnosis not present

## 2023-07-16 DIAGNOSIS — E1010 Type 1 diabetes mellitus with ketoacidosis without coma: Secondary | ICD-10-CM | POA: Diagnosis not present

## 2023-07-16 DIAGNOSIS — R110 Nausea: Secondary | ICD-10-CM | POA: Diagnosis not present

## 2023-07-16 DIAGNOSIS — E109 Type 1 diabetes mellitus without complications: Secondary | ICD-10-CM | POA: Diagnosis not present

## 2023-07-26 DIAGNOSIS — J988 Other specified respiratory disorders: Secondary | ICD-10-CM | POA: Diagnosis not present

## 2023-07-28 DIAGNOSIS — E876 Hypokalemia: Secondary | ICD-10-CM | POA: Diagnosis not present

## 2023-08-05 DIAGNOSIS — L821 Other seborrheic keratosis: Secondary | ICD-10-CM | POA: Diagnosis not present

## 2023-11-23 DIAGNOSIS — E1065 Type 1 diabetes mellitus with hyperglycemia: Secondary | ICD-10-CM | POA: Diagnosis not present

## 2023-11-25 DIAGNOSIS — E1065 Type 1 diabetes mellitus with hyperglycemia: Secondary | ICD-10-CM | POA: Diagnosis not present

## 2023-11-26 DIAGNOSIS — E109 Type 1 diabetes mellitus without complications: Secondary | ICD-10-CM | POA: Diagnosis not present

## 2023-11-26 DIAGNOSIS — Z961 Presence of intraocular lens: Secondary | ICD-10-CM | POA: Diagnosis not present

## 2023-12-22 ENCOUNTER — Other Ambulatory Visit: Payer: Self-pay | Admitting: Family Medicine

## 2023-12-22 DIAGNOSIS — Z1231 Encounter for screening mammogram for malignant neoplasm of breast: Secondary | ICD-10-CM

## 2024-01-04 DIAGNOSIS — E1065 Type 1 diabetes mellitus with hyperglycemia: Secondary | ICD-10-CM | POA: Diagnosis not present

## 2024-01-06 DIAGNOSIS — E1065 Type 1 diabetes mellitus with hyperglycemia: Secondary | ICD-10-CM | POA: Diagnosis not present

## 2024-01-19 DIAGNOSIS — M810 Age-related osteoporosis without current pathological fracture: Secondary | ICD-10-CM | POA: Diagnosis not present

## 2024-01-19 DIAGNOSIS — E1165 Type 2 diabetes mellitus with hyperglycemia: Secondary | ICD-10-CM | POA: Diagnosis not present

## 2024-01-19 DIAGNOSIS — E039 Hypothyroidism, unspecified: Secondary | ICD-10-CM | POA: Diagnosis not present

## 2024-01-19 DIAGNOSIS — Z13220 Encounter for screening for lipoid disorders: Secondary | ICD-10-CM | POA: Diagnosis not present

## 2024-01-19 DIAGNOSIS — Z0000 Encounter for general adult medical examination without abnormal findings: Secondary | ICD-10-CM | POA: Diagnosis not present

## 2024-01-22 DIAGNOSIS — F411 Generalized anxiety disorder: Secondary | ICD-10-CM | POA: Diagnosis not present

## 2024-01-22 DIAGNOSIS — E1165 Type 2 diabetes mellitus with hyperglycemia: Secondary | ICD-10-CM | POA: Diagnosis not present

## 2024-01-22 DIAGNOSIS — Z23 Encounter for immunization: Secondary | ICD-10-CM | POA: Diagnosis not present

## 2024-01-22 DIAGNOSIS — C50919 Malignant neoplasm of unspecified site of unspecified female breast: Secondary | ICD-10-CM | POA: Diagnosis not present

## 2024-01-22 DIAGNOSIS — E785 Hyperlipidemia, unspecified: Secondary | ICD-10-CM | POA: Diagnosis not present

## 2024-01-22 DIAGNOSIS — Z0000 Encounter for general adult medical examination without abnormal findings: Secondary | ICD-10-CM | POA: Diagnosis not present

## 2024-01-22 DIAGNOSIS — E039 Hypothyroidism, unspecified: Secondary | ICD-10-CM | POA: Diagnosis not present

## 2024-02-01 DIAGNOSIS — E1065 Type 1 diabetes mellitus with hyperglycemia: Secondary | ICD-10-CM | POA: Diagnosis not present

## 2024-02-03 DIAGNOSIS — E1065 Type 1 diabetes mellitus with hyperglycemia: Secondary | ICD-10-CM | POA: Diagnosis not present

## 2024-02-15 ENCOUNTER — Ambulatory Visit
Admission: RE | Admit: 2024-02-15 | Discharge: 2024-02-15 | Disposition: A | Discharge status: Home / Self Care | Source: Ambulatory Visit | Attending: Family Medicine | Admitting: Family Medicine

## 2024-02-15 DIAGNOSIS — Z1231 Encounter for screening mammogram for malignant neoplasm of breast: Secondary | ICD-10-CM | POA: Diagnosis not present

## 2024-05-31 ENCOUNTER — Inpatient Hospital Stay: Attending: Hematology and Oncology | Admitting: Hematology and Oncology

## 2024-05-31 VITALS — BP 135/61 | HR 82 | Temp 97.3°F | Resp 16 | Wt 124.7 lb

## 2024-05-31 DIAGNOSIS — C50412 Malignant neoplasm of upper-outer quadrant of left female breast: Secondary | ICD-10-CM | POA: Insufficient documentation

## 2024-05-31 DIAGNOSIS — Z9012 Acquired absence of left breast and nipple: Secondary | ICD-10-CM | POA: Insufficient documentation

## 2024-05-31 DIAGNOSIS — Z79811 Long term (current) use of aromatase inhibitors: Secondary | ICD-10-CM | POA: Insufficient documentation

## 2024-05-31 DIAGNOSIS — Z170 Estrogen receptor positive status [ER+]: Secondary | ICD-10-CM | POA: Insufficient documentation

## 2024-05-31 DIAGNOSIS — Z1732 Human epidermal growth factor receptor 2 negative status: Secondary | ICD-10-CM | POA: Insufficient documentation

## 2024-05-31 DIAGNOSIS — Z1721 Progesterone receptor positive status: Secondary | ICD-10-CM | POA: Diagnosis not present

## 2024-05-31 NOTE — Progress Notes (Signed)
 Patient Care Team: Kip Righter, MD as PCP - General (Family Medicine) Odean Potts, MD as Consulting Physician (Hematology and Oncology) Hyacinth Warren PARAS, FNP (Endocrinology)  DIAGNOSIS:  Encounter Diagnosis  Name Primary?   Malignant neoplasm of upper-outer quadrant of left breast in female, estrogen receptor positive (HCC) Yes    SUMMARY OF ONCOLOGIC HISTORY: Oncology History  Malignant neoplasm of upper-outer quadrant of left breast in female, estrogen receptor positive (HCC)  01/26/2020 Initial Diagnosis   Left breast biopsy: LCIS, CSL, fibroadenoma.  Foci mildly suspicious for early stromal invasion.  Pleomorphic features   02/06/2020 Cancer Staging   Staging form: Breast, AJCC 8th Edition - Clinical stage from 02/06/2020: Stage IB (cT2, cN0, cM0, G2, ER+, PR+, HER2-) - Signed by Odean Potts, MD on 02/06/2020   03/08/2020 Cancer Staging   Staging form: Breast, AJCC 8th Edition - Pathologic stage from 03/08/2020: Stage IA (pT2, pN0, cM0, G2, ER+, PR+, HER2-) - Signed by Crawford Morna Pickle, NP on 03/21/2020   03/08/2020 Surgery   Left mastectomy ([facility]): invasive and in situ lobular carcinoma, grade 2, 3.5cm, and multifocal invasive ductal carcinoma, grade 1, 0.6cm, clear margins, 3 left axillary lymph nodes negative for carcinoma.    03/21/2020 Oncotype testing   Oncotype testing showed a score of 17/5%     CHIEF COMPLIANT: Follow-up on anastrozole  therapy  HISTORY OF PRESENT ILLNESS:  History of Present Illness Patty Lopez is a 78 year old female with type 1 diabetes who presents with difficulties managing her blood sugar levels.  She has a history of breast cancer, with her last mammogram showing no evidence of disease. She is currently on a cancer medication, which she believes may cause muscle stiffness and fatigue.  She experiences significant fatigue, which she attributes to her diabetes. She uses an Omnipod insulin  pump but faces challenges with its  functionality, leading to fluctuating blood sugar levels. She administers two doses of insulin  upon waking due to rising blood sugar with any activity. She has experienced a past hospitalization for diabetic ketoacidosis following a colonoscopy, requiring ICU admission for severe dehydration and vomiting.  She takes a statin for high cholesterol, administered four times a day. She remains active by walking her dog but notes significant drops in blood sugar with physical exertion, causing exhaustion and hunger.     ALLERGIES:  has no known allergies.  MEDICATIONS:  Current Outpatient Medications  Medication Sig Dispense Refill   anastrozole  (ARIMIDEX ) 1 MG tablet Take 1 tablet (1 mg total) by mouth daily. 90 tablet 3   Calcium  Carb-Cholecalciferol (CALCIUM  + D3 PO) Take 1 tablet by mouth every evening.     clonazePAM  (KLONOPIN ) 1 MG tablet Take 0.5-1 mg by mouth See admin instructions. Take 0.5 mg by mouth at bedtime and an additional 1 mg once a day as needed for anxiety or sleep     Continuous Blood Gluc Receiver (FREESTYLE LIBRE 14 DAY READER) DEVI See admin instructions.     Continuous Glucose Sensor (FREESTYLE LIBRE 2 SENSOR) MISC Inject 1 Device into the skin every 14 (fourteen) days.     famotidine  (PEPCID ) 20 MG tablet Take 1 tablet (20 mg total) by mouth daily. 30 tablet 0   Insulin  Aspart (NOVOLOG  FLEXPEN RELION [place]) Inject 2-7 Units into the skin See admin instructions. Inject 2 units into the skin in the morning and 5-7 units three times a day with meals     LANTUS  SOLOSTAR 100 UNIT/ML Solostar Pen Inject 8 Units into the  skin at bedtime.     levothyroxine  (SYNTHROID ) 75 MCG tablet Take 75 mcg by mouth daily before breakfast.     metoCLOPramide  (REGLAN ) 10 MG tablet Take 1 tablet (10 mg total) by mouth every 6 (six) hours as needed for up to 7 days for nausea or vomiting. 28 tablet 0   Multiple Vitamin (MULTIVITAMIN WITH MINERALS) TABS tablet Take 1 tablet by mouth every evening.      rosuvastatin  (CRESTOR ) 5 MG tablet Take 5 mg by mouth 3 (three) times a week.     sertraline  (ZOLOFT ) 100 MG tablet Take 150 mg by mouth daily.     TYLENOL  500 MG tablet Take 500-1,000 mg by mouth every 6 (six) hours as needed for mild pain (pain score 1-3) or headache (or back pain).     No current facility-administered medications for this visit.    PHYSICAL EXAMINATION: ECOG PERFORMANCE STATUS: 1 - Symptomatic but completely ambulatory  Vitals:   05/31/24 1425  BP: 135/61  Pulse: 82  Resp: 16  Temp: (!) 97.3 F (36.3 C)  SpO2: 96%   Filed Weights   05/31/24 1425  Weight: 124 lb 11.2 oz (56.6 kg)    Physical Exam Left mastectomy: No palpable lumps or nodules.  Right breast scar tissue prior lumpectomy but otherwise no palpable lumps or nodules.     (exam performed in the presence of a chaperone)  LABORATORY DATA:  I have reviewed the data as listed    Latest Ref Rng & Units 07/13/2023    3:09 AM 07/12/2023    2:59 AM 07/11/2023    1:33 PM  CMP  Glucose 70 - 99 mg/dL 878  838  763   BUN 8 - 23 mg/dL 10  10  12    Creatinine 0.44 - 1.00 mg/dL 9.46  9.58  9.39   Sodium 135 - 145 mmol/L 128  135  135   Potassium 3.5 - 5.1 mmol/L 3.8  3.2  3.3   Chloride 98 - 111 mmol/L 94  103  105   CO2 22 - 32 mmol/L 24  24  24    Calcium  8.9 - 10.3 mg/dL 8.6  8.7  9.1   Total Protein 6.5 - 8.1 g/dL 6.2  6.0    Total Bilirubin <1.2 mg/dL 1.2  0.9    Alkaline Phos 38 - 126 U/L 45  36    AST 15 - 41 U/L 14  16    ALT 0 - 44 U/L 17  16      Lab Results  Component Value Date   WBC 7.9 07/13/2023   HGB 13.0 07/13/2023   HCT 37.5 07/13/2023   MCV 96.6 07/13/2023   PLT 171 07/13/2023   NEUTROABS 5.3 07/13/2023    ASSESSMENT & PLAN:  Malignant neoplasm of upper-outer quadrant of left breast in female, estrogen receptor positive (HCC) Mammogram detected left breast mass with a history of right breast cancer in 2002 treated with lumpectomy chemotherapy and radiation.  The left  breast mass measured 2.6 cm.  Biopsy revealed grade 2 invasive lobular carcinoma ER/PR positive HER-2 negative with a Ki-67 of 15%.  Additional biopsies revealed LCIS and CSL.   T2N0 stage Ib   Recommendations: 1.  03/08/2020:Left mastectomy ([facility]): invasive and in situ lobular carcinoma, grade 2, 3.5cm, and multifocal invasive ductal carcinoma, grade 1, 0.6cm, clear margins, 3 left axillary lymph nodes negative for carcinoma. 2. Oncotype DX testing: Score of 17, risk of distant recurrence: 5% (no role of chemotherapy  3. Adjuvant antiestrogen therapy ---------------------------------------------------------------------------------------------------------------------------------------------- Treatment plan: Adjuvant antiestrogen therapy with anastrozole  1 mg daily x5-7 years   Anastrozole  toxicities: 1.  Hot flashes 2. leg cramps   Breast cancer surveillance: Right breast mammogram: 02/17/2024: Benign breast density category C Breast exam 05/31/2024: Benign  Return to clinic in 1 year for follow-up  Assessment & Plan Estrogen receptor positive left breast cancer Cancer-free with no recurrence on recent imaging and mammograms. - Ensure medication refills for cancer treatment are up to date.  Type 1 diabetes mellitus with labile blood glucose Significant glucose management challenges with recent diabetic ketoacidosis post-colonoscopy. Insulin  pump and sensor connectivity issues causing anxiety and stress. - Continue using insulin  pump and monitor for connectivity issues. - Educate on proper sensor placement and stress management. - Monitor blood glucose levels closely and adjust insulin  as needed.  Hyperlipidemia - Continue current statin therapy.      No orders of the defined types were placed in this encounter.  The patient has a good understanding of the overall plan. she agrees with it. she will call with any problems that may develop before the next visit here. Total time  spent: 30 mins including face to face time and time spent for planning, charting and co-ordination of care   Viinay K Melik Blancett, MD 05/31/24

## 2024-05-31 NOTE — Assessment & Plan Note (Signed)
 Mammogram detected left breast mass with a history of right breast cancer in 2002 treated with lumpectomy chemotherapy and radiation.  The left breast mass measured 2.6 cm.  Biopsy revealed grade 2 invasive lobular carcinoma ER/PR positive HER-2 negative with a Ki-67 of 15%.  Additional biopsies revealed LCIS and CSL.   T2N0 stage Ib   Recommendations: 1.  03/08/2020:Left mastectomy ([facility]): invasive and in situ lobular carcinoma, grade 2, 3.5cm, and multifocal invasive ductal carcinoma, grade 1, 0.6cm, clear margins, 3 left axillary lymph nodes negative for carcinoma. 2. Oncotype DX testing: Score of 17, risk of distant recurrence: 5% (no role of chemotherapy 3. Adjuvant antiestrogen therapy ---------------------------------------------------------------------------------------------------------------------------------------------- Treatment plan: Adjuvant antiestrogen therapy with anastrozole  1 mg daily x5-7 years   Anastrozole  toxicities: 1.  Hot flashes 2. leg cramps   Breast cancer surveillance: Right breast mammogram: 02/17/2024: Benign breast density category C Breast exam 05/31/2024: Benign  Return to clinic in 1 year for follow-up

## 2024-06-01 ENCOUNTER — Ambulatory Visit: Payer: PPO | Admitting: Hematology and Oncology

## 2024-06-07 DIAGNOSIS — E1065 Type 1 diabetes mellitus with hyperglycemia: Secondary | ICD-10-CM | POA: Diagnosis not present

## 2024-06-21 DIAGNOSIS — E1065 Type 1 diabetes mellitus with hyperglycemia: Secondary | ICD-10-CM | POA: Diagnosis not present

## 2024-06-22 DIAGNOSIS — E1065 Type 1 diabetes mellitus with hyperglycemia: Secondary | ICD-10-CM | POA: Diagnosis not present

## 2024-07-10 ENCOUNTER — Other Ambulatory Visit: Payer: Self-pay | Admitting: Hematology and Oncology

## 2024-07-10 DIAGNOSIS — C50412 Malignant neoplasm of upper-outer quadrant of left female breast: Secondary | ICD-10-CM

## 2024-08-01 DIAGNOSIS — E1165 Type 2 diabetes mellitus with hyperglycemia: Secondary | ICD-10-CM | POA: Diagnosis not present

## 2024-08-01 DIAGNOSIS — E785 Hyperlipidemia, unspecified: Secondary | ICD-10-CM | POA: Diagnosis not present

## 2024-08-01 DIAGNOSIS — C50919 Malignant neoplasm of unspecified site of unspecified female breast: Secondary | ICD-10-CM | POA: Diagnosis not present

## 2024-08-01 DIAGNOSIS — E039 Hypothyroidism, unspecified: Secondary | ICD-10-CM | POA: Diagnosis not present

## 2024-08-01 DIAGNOSIS — F411 Generalized anxiety disorder: Secondary | ICD-10-CM | POA: Diagnosis not present

## 2024-09-14 ENCOUNTER — Other Ambulatory Visit: Payer: Self-pay | Admitting: Family Medicine

## 2024-09-14 DIAGNOSIS — Z1231 Encounter for screening mammogram for malignant neoplasm of breast: Secondary | ICD-10-CM

## 2024-10-05 ENCOUNTER — Other Ambulatory Visit: Payer: Self-pay | Admitting: Hematology and Oncology

## 2024-10-05 DIAGNOSIS — C50412 Malignant neoplasm of upper-outer quadrant of left female breast: Secondary | ICD-10-CM

## 2025-02-17 ENCOUNTER — Ambulatory Visit

## 2025-05-31 ENCOUNTER — Ambulatory Visit: Admitting: Hematology and Oncology
